# Patient Record
Sex: Female | Born: 1990 | Race: Black or African American | Hispanic: No | Marital: Single | Smoking: Never smoker
Health system: Southern US, Community
[De-identification: ages and names within clinical notes are randomized; demographics above are authoritative.]

## PROBLEM LIST (undated history)

## (undated) DIAGNOSIS — K449 Diaphragmatic hernia without obstruction or gangrene: Secondary | ICD-10-CM

## (undated) DIAGNOSIS — F419 Anxiety disorder, unspecified: Secondary | ICD-10-CM

## (undated) HISTORY — PX: WISDOM TOOTH EXTRACTION: SHX21

---

## 2011-05-22 ENCOUNTER — Emergency Department (INDEPENDENT_AMBULATORY_CARE_PROVIDER_SITE_OTHER): Payer: Medicaid Other

## 2011-05-22 ENCOUNTER — Emergency Department (HOSPITAL_COMMUNITY)
Admission: EM | Admit: 2011-05-22 | Discharge: 2011-05-22 | Disposition: A | Discharge status: Home / Self Care | Payer: Medicaid Other | Source: Home / Self Care | Attending: Emergency Medicine | Admitting: Emergency Medicine

## 2011-05-22 ENCOUNTER — Encounter (HOSPITAL_COMMUNITY): Payer: Self-pay | Admitting: *Deleted

## 2011-05-22 DIAGNOSIS — J40 Bronchitis, not specified as acute or chronic: Secondary | ICD-10-CM

## 2011-05-22 MED ORDER — ALBUTEROL SULFATE HFA 108 (90 BASE) MCG/ACT IN AERS: 1.0000 | INHALATION_SPRAY | Freq: Four times a day (QID) | RESPIRATORY_TRACT | Status: DC | PRN

## 2011-05-22 MED ORDER — ALBUTEROL SULFATE (5 MG/ML) 0.5% IN NEBU
5.0000 mg | INHALATION_SOLUTION | Freq: Once | RESPIRATORY_TRACT | Status: AC
  Administered 2011-05-22: 5 mg via RESPIRATORY_TRACT

## 2011-05-22 MED ORDER — PREDNISONE 20 MG PO TABS: 60.0000 mg | ORAL_TABLET | Freq: Every day | ORAL | Status: AC

## 2011-05-22 MED ORDER — IPRATROPIUM BROMIDE 0.02 % IN SOLN
0.5000 mg | Freq: Once | RESPIRATORY_TRACT | Status: AC
  Administered 2011-05-22: 0.5 mg via RESPIRATORY_TRACT

## 2011-05-22 MED ORDER — HYDROCODONE-HOMATROPINE 5-1.5 MG/5ML PO SYRP: 5.0000 mL | ORAL_SOLUTION | Freq: Four times a day (QID) | ORAL | Status: AC | PRN

## 2011-05-22 MED ORDER — ALBUTEROL SULFATE (5 MG/ML) 0.5% IN NEBU
INHALATION_SOLUTION | RESPIRATORY_TRACT | Status: AC
  Filled 2011-05-22: qty 0.5

## 2011-05-22 NOTE — ED Notes (Signed)
Co nonproductive cough x 3 wks, has taken mucinex and tylenol with no improvement

## 2011-05-22 NOTE — ED Provider Notes (Signed)
History     CSN: [id number]  Arrival date & time 05/22/11  1618   First MD Initiated Contact with Patient 05/22/11 1712      Chief Complaint  Patient presents with  . Cough    (Consider location/radiation/quality/duration/timing/severity/associated sxs/prior treatment) HPI Comments: Patient nonproductive cough for 3 weeks. States cough started while having upper respiratory infection with nasal congestion, rhinorrhea.Marland Kitchen URI improved however, patient now with wheezing, chest tightness, chest congestion. No nausea, vomiting, fevers, shortness of breath, abdominal pain. No malaise, fatigue, body aches. Is able to sleep at night. No reflux symptoms. Has been taking the Mucinex and Tylenol without improvement.  ROS as noted in HPI. All other ROS negative.   Patient is a 21 y.o. female presenting with cough.  Cough    History reviewed. No pertinent past medical history.  History reviewed. No pertinent past surgical history.  History reviewed. No pertinent family history.  History  Substance Use Topics  . Smoking status: Never Smoker   . Smokeless tobacco: Not on file  . Alcohol Use: Yes    OB History    Grav Para Term Preterm Abortions TAB SAB Ect Mult Living                  Review of Systems  Respiratory: Positive for cough.     Allergies  Robitussin and Sudafed  Home Medications   Current Outpatient Rx  Name Route Sig Dispense Refill  . GUAIFENESIN ER 600 MG PO TB12 Oral Take 1,200 mg by mouth 2 (two) times daily.    . ALBUTEROL SULFATE HFA 108 (90 BASE) MCG/ACT IN AERS Inhalation Inhale 1-2 puffs into the lungs every 6 (six) hours as needed for wheezing. 1 Inhaler 0  . HYDROCODONE-HOMATROPINE 5-1.5 MG/5ML PO SYRP Oral Take 5 mLs by mouth every 6 (six) hours as needed for cough or pain. 120 mL 0  . PREDNISONE 20 MG PO TABS Oral Take 3 tablets (60 mg total) by mouth daily. 15 tablet 0    BP 134/94  Pulse 80  Temp(Src) 98.5 F (36.9 C) (Oral)  Resp 20  SpO2  100%  Physical Exam  Nursing note and vitals reviewed. Constitutional: She is oriented to person, place, and time. She appears well-developed and well-nourished.  HENT:  Head: Normocephalic and atraumatic.  Right Ear: Tympanic membrane and ear canal normal.  Left Ear: Tympanic membrane and ear canal normal.  Nose: Nose normal.  Mouth/Throat: Uvula is midline, oropharynx is clear and moist and mucous membranes are normal.       (-) frontal, maxillary sinus tenderness  Eyes: Conjunctivae and EOM are normal.  Neck: Normal range of motion. Neck supple.  Cardiovascular: Normal rate, regular rhythm and normal heart sounds.   Pulmonary/Chest: Effort normal. No respiratory distress. She has wheezes. She has no rales. She exhibits no tenderness.  Abdominal: Soft. Bowel sounds are normal. There is no tenderness.  Musculoskeletal: Normal range of motion.  Lymphadenopathy:    She has no cervical adenopathy.  Neurological: She is alert and oriented to person, place, and time.  Skin: Skin is warm and dry. No rash noted.  Psychiatric: She has a normal mood and affect. Her behavior is normal. Judgment and thought content normal.    ED Course  Procedures (including critical care time)  Labs Reviewed - No data to display Dg Chest 2 View  05/22/2011  *RADIOLOGY REPORT*  Clinical Data: Cough.  CHEST - 2 VIEW  Comparison: None.  Findings: Cardiac and mediastinal contours  appear normal.  The lungs appear clear.  No pleural effusion is identified.  IMPRESSION:  No significant abnormality identified.  Original Report Authenticated By: Dellia Cloud, M.D.     1. Bronchitis      MDM  Patient with cough for 3 weeks. Has scattered wheezing throughout all lung fields. No history of asthma. H&P more consistent with bronchitis and pneumonia. Will give DuoNeb, check chest x-ray. Will reevaluate.  X-ray reviewed by myself. Report per radiologist.  On reevaluation, improved air movement, decreased  wheezing. Patient states she feels slightly better. Discussed imaging results with mother and patient.  Luiz Blare, MD 05/22/11 2151

## 2011-05-22 NOTE — ED Notes (Signed)
Pt receiving breathing treatment unable to transport to xray

## 2019-01-29 ENCOUNTER — Other Ambulatory Visit: Payer: Self-pay | Admitting: Otolaryngology

## 2019-01-29 DIAGNOSIS — R221 Localized swelling, mass and lump, neck: Secondary | ICD-10-CM

## 2019-02-04 ENCOUNTER — Ambulatory Visit
Admission: RE | Admit: 2019-02-04 | Discharge: 2019-02-04 | Disposition: A | Discharge status: Home / Self Care | Payer: Managed Care, Other (non HMO) | Source: Ambulatory Visit | Attending: Otolaryngology | Admitting: Otolaryngology

## 2019-02-04 ENCOUNTER — Other Ambulatory Visit: Payer: Self-pay

## 2019-02-04 DIAGNOSIS — R221 Localized swelling, mass and lump, neck: Secondary | ICD-10-CM

## 2019-02-04 MED ORDER — IOPAMIDOL (ISOVUE-300) INJECTION 61%
75.0000 mL | Freq: Once | INTRAVENOUS | Status: AC | PRN
  Administered 2019-02-04: 75 mL via INTRAVENOUS

## 2019-02-07 ENCOUNTER — Other Ambulatory Visit: Payer: Self-pay | Admitting: Otolaryngology

## 2019-02-07 ENCOUNTER — Other Ambulatory Visit (HOSPITAL_COMMUNITY): Payer: Self-pay | Admitting: Otolaryngology

## 2019-02-07 DIAGNOSIS — E041 Nontoxic single thyroid nodule: Secondary | ICD-10-CM

## 2019-02-07 DIAGNOSIS — R590 Localized enlarged lymph nodes: Secondary | ICD-10-CM

## 2019-02-07 DIAGNOSIS — R221 Localized swelling, mass and lump, neck: Secondary | ICD-10-CM

## 2019-02-14 ENCOUNTER — Encounter (HOSPITAL_COMMUNITY): Payer: Self-pay | Admitting: Emergency Medicine

## 2019-02-14 ENCOUNTER — Emergency Department (HOSPITAL_COMMUNITY)
Admission: EM | Admit: 2019-02-14 | Discharge: 2019-02-15 | Disposition: A | Discharge status: Home / Self Care | Payer: Managed Care, Other (non HMO) | Attending: Emergency Medicine | Admitting: Emergency Medicine

## 2019-02-14 ENCOUNTER — Other Ambulatory Visit: Payer: Self-pay

## 2019-02-14 ENCOUNTER — Emergency Department (HOSPITAL_COMMUNITY): Payer: Managed Care, Other (non HMO)

## 2019-02-14 DIAGNOSIS — R0602 Shortness of breath: Secondary | ICD-10-CM

## 2019-02-14 DIAGNOSIS — R079 Chest pain, unspecified: Secondary | ICD-10-CM

## 2019-02-14 MED ORDER — SODIUM CHLORIDE 0.9% FLUSH: 3.0000 mL | Freq: Once | INTRAVENOUS | Status: DC

## 2019-02-14 NOTE — ED Triage Notes (Signed)
Patient with chest discomfort, states that it comes and goes, she states that it is a funny feeling, not necessarily a pain, but tightness.  She states she saw her PCP on Monday and she said that she was not clear in her lungs.  She has had a "gunky" feeling.

## 2019-02-15 ENCOUNTER — Emergency Department (HOSPITAL_COMMUNITY): Payer: Managed Care, Other (non HMO)

## 2019-02-15 LAB — BASIC METABOLIC PANEL
Anion gap: 11 (ref 5–15)
BUN: 17 mg/dL (ref 6–20)
CO2: 24 mmol/L (ref 22–32)
Calcium: 9.7 mg/dL (ref 8.9–10.3)
Chloride: 103 mmol/L (ref 98–111)
Creatinine, Ser: 0.89 mg/dL (ref 0.44–1.00)
GFR calc Af Amer: >60 mL/min (ref >60)
GFR calc non Af Amer: >60 mL/min (ref >60)
Glucose, Bld: 93 mg/dL (ref 70–99)
Potassium: 4 mmol/L (ref 3.5–5.1)
Sodium: 138 mmol/L (ref 135–145)

## 2019-02-15 LAB — CBC
HCT: 43 % (ref 36.0–46.0)
Hemoglobin: 13.5 g/dL (ref 12.0–15.0)
MCH: 28.5 pg (ref 26.0–34.0)
MCHC: 31.4 g/dL (ref 30.0–36.0)
MCV: 90.7 fL (ref 80.0–100.0)
Platelets: 305 10*3/uL (ref 150–400)
RBC: 4.74 MIL/uL (ref 3.87–5.11)
RDW: 12.2 % (ref 11.5–15.5)
WBC: 12.9 10*3/uL — ABNORMAL HIGH (ref 4.0–10.5)
nRBC: 0 % (ref 0.0–0.2)

## 2019-02-15 LAB — I-STAT BETA HCG BLOOD, ED (MC, WL, AP ONLY): I-stat hCG, quantitative: <5 m[IU]/mL (ref <5)

## 2019-02-15 LAB — TROPONIN I (HIGH SENSITIVITY)
Troponin I (High Sensitivity): 3 ng/L (ref <18)
Troponin I (High Sensitivity): 4 ng/L (ref <18)

## 2019-02-15 LAB — D-DIMER, QUANTITATIVE: D-Dimer, Quant: 0.88 ug/mL-FEU — ABNORMAL HIGH (ref 0.00–0.50)

## 2019-02-15 MED ORDER — IOHEXOL 350 MG/ML SOLN
100.0000 mL | Freq: Once | INTRAVENOUS | Status: AC | PRN
  Administered 2019-02-15: 56 mL via INTRAVENOUS

## 2019-02-15 NOTE — ED Notes (Signed)
ED Provider at bedside. 

## 2019-02-15 NOTE — ED Notes (Addendum)
Patient returned from [place].  Transporter reports can't power inject through IV.

## 2019-02-15 NOTE — ED Notes (Signed)
Patient verbalizes understanding of discharge instructions. Opportunity for questioning and answers were provided. Armband removed by staff, pt discharged from ED home via POV.  

## 2019-02-15 NOTE — Discharge Instructions (Addendum)
You have been seen today for chest pain. Please read and follow all provided instructions. Return to the emergency room for worsening condition or new concerning symptoms.    Your CT scan did not show a blood clot.  It does show small hiatal hernia. I have included information about them for you to look over.   1. Medications:  You can take tylenol and ibuprofen as needed for pain. Take as directed on the bottle. Continue usual home medications.   2. Treatment: rest, drink plenty of fluids  3. Follow Up: Please follow up with your primary doctor in 2-5 days for discussion of your diagnoses and further evaluation after today's visit; Call today to arrange your follow up.  -Also recommend you follow up with ENT for your biopsy as planned  It is also a possibility that you have an allergic reaction to any of the medicines that you have been prescribed - Everybody reacts differently to medications and while MOST people have no trouble with most medicines, you may have a reaction such as nausea, vomiting, rash, swelling, shortness of breath. If this is the case, please stop taking the medicine immediately and contact your physician.  ?

## 2019-02-15 NOTE — ED Notes (Signed)
Patient transported to CT 

## 2019-02-15 NOTE — ED Provider Notes (Signed)
[place] EMERGENCY DEPARTMENT Provider Note   CSN: [id number] Arrival date & time: 02/14/19  2308     History   Chief Complaint Chief Complaint  Patient presents with  . Chest Pain    HPI Aleene Swanner is a 28 y.o. female with a hx of major medical problems presents to the Emergency Department complaining of intermittent chest tightness and discomfort over the last several days.  Patient reports there is no chest pain but sometimes she feels as if she is having to focus on breathing or take a deep breath to get enough oxygen.  No specific aggravating or alleviating factors.  The symptoms are not worse with exertion.  She denies orthopnea.  No leg swelling.  She does not take an oral contraceptive.  No history of cancer, lupus, previous DVT/PE, leg swelling.  Patient does report that she had Covid in July and though recovered has had some intermittent but strange symptoms including lymphadenopathy over the last several months.  (Of note: CT scan of her neck revealed some global lymphadenopathy for which she was evaluated by ENT.  They did recommend a lymph node biopsy to rule out lymphoma.  She has not yet been able to schedule this.)  She did see her primary care about this chest tightness 2 days ago and was given an antibiotic.  It has not helped her symptoms.  She was retested for Covid at that time and found to be negative.  Additionally, patient reports she is some anxious about the symptoms.     The history is provided by the patient, medical records and a friend. No language interpreter was used.    History reviewed. No pertinent past medical history.  There are no active problems to display for this patient.   History reviewed. No pertinent surgical history.   OB History   No obstetric history on file.      Home Medications    Prior to Admission medications   Medication Sig Start Date End Date Taking? Authorizing Provider  albuterol (PROVENTIL  HFA;VENTOLIN HFA) 108 (90 BASE) MCG/ACT inhaler Inhale 1-2 puffs into the lungs every 6 (six) hours as needed for wheezing. 05/22/11 05/21/12  Melynda Ripple, MD  guaiFENesin (MUCINEX) 600 MG 12 hr tablet Take 1,200 mg by mouth 2 (two) times daily.    [provider]    Family History No family history on file.  Social History Social History   Tobacco Use  . Smoking status: Never Smoker  Substance Use Topics  . Alcohol use: Yes  . Drug use: No     Allergies   Robitussin [guaifenesin] and Sudafed [pseudoephedrine hcl]   Review of Systems Review of Systems  Constitutional: Negative for appetite change, diaphoresis, fatigue, fever and unexpected weight change.  HENT: Negative for mouth sores.   Eyes: Negative for visual disturbance.  Respiratory: Negative for cough, chest tightness, shortness of breath and wheezing.   Cardiovascular: Positive for chest pain.  Gastrointestinal: Negative for abdominal pain, constipation, diarrhea, nausea and vomiting.  Endocrine: Negative for polydipsia, polyphagia and polyuria.  Genitourinary: Negative for dysuria, frequency, hematuria and urgency.  Musculoskeletal: Negative for back pain and neck stiffness.  Skin: Negative for rash.  Allergic/Immunologic: Negative for immunocompromised state.  Neurological: Negative for syncope, light-headedness and headaches.  Hematological: Does not bruise/bleed easily.  Psychiatric/Behavioral: Negative for sleep disturbance. The patient is nervous/anxious.      Physical Exam Updated Vital Signs BP 123/60 (BP Location: Right Arm)   Pulse Marland Kitchen)  114   Temp 98.5 F (36.9 C) (Oral)   Resp 18   SpO2 100%   Physical Exam Vitals signs and nursing note reviewed.  Constitutional:      General: She is not in acute distress.    Appearance: She is not diaphoretic.  HENT:     Head: Normocephalic.  Eyes:     General: No scleral icterus.    Conjunctiva/sclera: Conjunctivae normal.  Neck:      Musculoskeletal: Normal range of motion.  Cardiovascular:     Rate and Rhythm: Normal rate and regular rhythm.     Pulses: Normal pulses.          Radial pulses are 2+ on the right side and 2+ on the left side.     Heart sounds: Normal heart sounds. No murmur.  Pulmonary:     Effort: Pulmonary effort is normal. No tachypnea, accessory muscle usage, prolonged expiration, respiratory distress or retractions.     Breath sounds: Normal breath sounds. No stridor. No decreased breath sounds, wheezing, rhonchi or rales.     Comments: Equal chest rise. No increased work of breathing. Abdominal:     General: There is no distension.     Palpations: Abdomen is soft.     Tenderness: There is no abdominal tenderness. There is no guarding or rebound.  Musculoskeletal:     Right lower leg: She exhibits no tenderness. No edema.     Left lower leg: She exhibits no tenderness. No edema.     Comments: Moves all extremities equally and without difficulty.  Skin:    General: Skin is warm and dry.     Capillary Refill: Capillary refill takes less than 2 seconds.  Neurological:     Mental Status: She is alert.     GCS: GCS eye subscore is 4. GCS verbal subscore is 5. GCS motor subscore is 6.     Comments: Speech is clear and goal oriented.  Psychiatric:        Mood and Affect: Mood normal.      ED Treatments / Results  Labs (all labs ordered are listed, but only abnormal results are displayed) Labs Reviewed  CBC - Abnormal; Notable for the following components:      Result Value   WBC 12.9 (*)    All other components within normal limits  D-DIMER, QUANTITATIVE (NOT AT [facility]) - Abnormal; Notable for the following components:   D-Dimer, Quant 0.88 (*)    All other components within normal limits  BASIC METABOLIC PANEL  I-STAT BETA HCG BLOOD, ED ([facility], [facility], [facility] ONLY)  TROPONIN I (HIGH SENSITIVITY)  TROPONIN I (HIGH SENSITIVITY)    EKG EKG Interpretation  Date/Time:  Thursday February 14 2019  23:25:32 EDT Ventricular Rate:  91 PR Interval:  156 QRS Duration: 80 QT Interval:  360 QTC Calculation: 442 R Axis:   22 Text Interpretation:  Normal sinus rhythm Cannot rule out Anterior infarct , age undetermined Abnormal ECG No previous ECGs available Confirmed by Zadie Rhine ([id number]) on 02/15/2019 5:16:23 AM   Radiology Dg Chest 2 View  Result Date: 02/14/2019 CLINICAL DATA:  Chest pain. Intermittent chest discomfort. EXAM: CHEST - 2 VIEW COMPARISON:  05/22/2011 FINDINGS: The cardiomediastinal contours are normal. Mild central bronchial thickening. Pulmonary vasculature is normal. No consolidation, pleural effusion, or pneumothorax. Minimal degenerative change in the midthoracic spine. No acute osseous abnormalities are seen. IMPRESSION: Mild central bronchial thickening. Electronically Signed   By: Narda Rutherford M.D.   On: 02/14/2019  23:56    Procedures Procedures (including critical care time)  Medications Ordered in ED Medications  sodium chloride flush (NS) 0.9 % injection 3 mL (has no administration in time range)     Initial Impression / Assessment and Plan / ED Course  I have reviewed the triage vital signs and the nursing notes.  Pertinent labs & imaging results that were available during my care of the patient were reviewed by me and considered in my medical decision making (see chart for details).  Clinical Course as of Feb 14 706  Fri Feb 15, 2019  [id number] Tachycardic on arrival.  No tachycardia on my clinical exam.  Pulse Rate(!): 114 [HM]  0553 Mild leukocytosis noted however other labs are reassuring.  WBC(!): 12.9 [HM]  0554 I personally evaluated these images.  No evidence of pneumonia, pulmonary edema or pneumothorax.  DG Chest 2 View [HM]  0554 No evidence of ischemia.  EKG 12-Lead [HM]  0554 Initial and repeat troponin are negative.  Troponin I (High Sensitivity): 3 [HM]    Clinical Course User Index [HM] Kellin Bartling, Dahlia ClientHannah, PA-C         Patient presents to the emergency department with complaints of shortness of breath and chest pressure.  She is tachycardic on arrival.  All of her other vital signs are within normal limits.  Labs are reassuring.  Mild leukocytosis.  Given her tachycardia and consistent symptoms for several days along with recent Covid infection and concern about possible lymphoma I have concern for increased risk for pulmonary embolism.  She is otherwise low risk.  Will obtain D-dimer.  D-dimer elevated at 0.88.  At this time I recommended CT scan of the chest.  Discussed the risk versus benefit with the patient and she agrees to move forward with CT scan.  7:03 AM At shift change, [facility] PA-[facility] who will follow results of CT scan and disposition accordingly.   Final Clinical Impressions(s) / ED Diagnoses   Final diagnoses:  Shortness of breath    ED Discharge Orders    None       Mardene SayerMuthersbaugh, Boyd KerbsHannah, PA-C 02/15/19 [id number]    Zadie RhineWickline, Donald, MD 02/15/19 [phone]

## 2019-02-15 NOTE — ED Provider Notes (Signed)
Care assumed from H. Muthersbaugh PA-C at shift change pending CTA chest.  See her note for full H&P.   Work up has been significant for elevated D-dimer at 0.88. Negative pregnancy test, negative troponin, nonspecific leukocytosis 12.9. EKG normal sinus rhythm. Chest xray without acute infectious findings, only mild central bronchial thickening.  If CTA is negative and vitals are stable pt can be discharged home with pcp follow up. If any findings will follow up accordingly. Pt is resting comfortably in bed at this time.     Physical Exam  BP (!) 150/108 (BP Location: Right Wrist)   Pulse 89   Temp 98.5 F (36.9 C) (Oral)   Resp 14   SpO2 100%   Physical Exam  PE: Constitutional: well-developed, well-nourished, no apparent distress HENT: normocephalic, atraumatic Cardiovascular: normal rate and rhythm, distal pulses intact. Radial pulses 2+ bilaterally.  Pulmonary/Chest: effort normal; breath sounds clear and equal bilaterally; no wheezes or rales. Abdominal: soft and nontender Musculoskeletal: full ROM, no edema Neurological: alert with goal directed thinking Skin: warm and dry, no rash, no diaphoresis Psychiatric: normal mood and affect, normal behavior    ED Course/Procedures   Clinical Course as of Feb 15 719  Fri Feb 15, 2019  0553 Tachycardic on arrival.  No tachycardia on my clinical exam.  Pulse Rate(!): 114 [HM]  0553 Mild leukocytosis noted however other labs are reassuring.  WBC(!): 12.9 [HM]  0554 I personally evaluated these images.  No evidence of pneumonia, pulmonary edema or pneumothorax.  DG Chest 2 View [HM]  5361 No evidence of ischemia.  EKG 12-Lead [HM]  0554 Initial and repeat troponin are negative.  Troponin I (High Sensitivity): 3 [HM]    Clinical Course User Index [HM] Muthersbaugh, Gwenlyn Perking   CHEST - 2 VIEW    COMPARISON: 05/22/2011    FINDINGS:  The cardiomediastinal contours are normal. Mild central bronchial  thickening.  Pulmonary vasculature is normal. No consolidation,  pleural effusion, or pneumothorax. Minimal degenerative change in  the midthoracic spine. No acute osseous abnormalities are seen.    IMPRESSION:  Mild central bronchial thickening.      Electronically Signed  By: Keith Rake M.D.  On: 02/14/2019 23:56     EKG Interpretation  Date/Time:  Thursday February 14 2019 23:25:32 EDT Ventricular Rate:  91 PR Interval:  156 QRS Duration: 80 QT Interval:  360 QTC Calculation: 442 R Axis:   22 Text Interpretation:  Normal sinus rhythm Cannot rule out Anterior infarct , age undetermined Abnormal ECG No previous ECGs available Confirmed by Ripley Fraise [phone]) on 02/15/2019 5:16:23 AM        MDM    Pt received in sign out pending CTA chest. Her history is significant for Covid diagnosis x3 months ago. Pt reportedly recovered but has had intermittent symptoms including several months of lymphadenopathy that is currently being followed by ENT. Had recent negative Covid test. As mentioned above labs today are significant for elevated dimer 0.88. On reassessment she is pain free.  Her lungs are clear to auscultation all fields.  She has normal work of breathing symmetric chest rise.  Abdomen is soft nontender, no peritoneal signs.  CTA chest is negative for PE. Incidental finding of small hiatal hernia. Discussed results with patient.  The patient appears reasonably screened and/or stabilized for discharge and I doubt any other medical condition or other [facility] requiring further screening, evaluation, or treatment in the ED at this time prior to discharge. The patient is safe for  discharge with strict return precautions discussed. Recommend pcp follow up.   Portions of this note were generated with Scientist, clinical (histocompatibility and immunogenetics). Dictation errors may occur despite best attempts at proofreading.    Vitals:   02/15/19 0626 02/15/19 0659 02/15/19 0730 02/15/19 0945  BP: (!) 96/53 (!)  150/108 (!) 143/54 112/75  Pulse: 89  83 93  Resp: 14  20 18   Temp: 98.5 F (36.9 C)  98.2 F (36.8 C) 98.2 F (36.8 C)  TempSrc: Oral  Oral Oral  SpO2: 100%  99% 100%       , PA-C 02/15/19 1547    02/17/19, MD 02/18/19 0730

## 2019-02-15 NOTE — ED Notes (Addendum)
Per CT, patient needs IV gauge 20 for CT.  Will place IV team consult.

## 2019-02-15 NOTE — ED Notes (Signed)
Patient transported to CT by RN.  CT to transport patient to room 47 in adult ED after CT.  Notified Event organiser, Therapist, sports.

## 2019-02-18 ENCOUNTER — Encounter (HOSPITAL_COMMUNITY): Payer: Self-pay | Admitting: Radiology

## 2019-02-18 NOTE — Progress Notes (Unsigned)
[place] Female, 28 y.o., December 12, 1990 MRN:  [id number] Phone:  [phone] Jerilynn Mages) PCP:  Ronita Hipps, MD Coverage:  [employer] Managed Next Appt With Radiology ([facility] 2) 02/22/2019 at 1:00 PM  FW: Biopsu Received: Today Message Contents  Donn Pierini D      Previous Messages  ----- Message -----  From: Markus Daft, MD  Sent: 02/08/2019  9:11 AM EDT  To: Lenore Cordia  Subject: RE: Biopsu                    Ok for US guided cervical lymph node biopsy.   [place]  ----- Message -----  From: Lenore Cordia  Sent: 02/07/2019  5:05 PM EDT  To: Ir Procedure Requests  Subject: Biopsu                      Procedure Requested: US Biopsy    Reason for Procedure: Cervical lymphadenopathy. Please sample the left jugulodigastric node which is the largest    Provider Requesting: Dr Izora Gala  Provider Telephone: [phone]    Other Info: Rad exam in Epic

## 2019-02-21 ENCOUNTER — Other Ambulatory Visit: Payer: Self-pay | Admitting: Radiology

## 2019-02-22 ENCOUNTER — Ambulatory Visit (HOSPITAL_COMMUNITY)
Admission: RE | Admit: 2019-02-22 | Discharge: 2019-02-22 | Disposition: A | Discharge status: Home / Self Care | Payer: Managed Care, Other (non HMO) | Source: Ambulatory Visit | Attending: Otolaryngology | Admitting: Otolaryngology

## 2019-02-22 ENCOUNTER — Other Ambulatory Visit: Payer: Self-pay

## 2019-02-22 DIAGNOSIS — R590 Localized enlarged lymph nodes: Secondary | ICD-10-CM | POA: Insufficient documentation

## 2019-02-22 DIAGNOSIS — Z79899 Other long term (current) drug therapy: Secondary | ICD-10-CM | POA: Diagnosis not present

## 2019-02-22 DIAGNOSIS — R221 Localized swelling, mass and lump, neck: Secondary | ICD-10-CM

## 2019-02-22 LAB — CBC
HCT: 40.1 % (ref 36.0–46.0)
Hemoglobin: 12.8 g/dL (ref 12.0–15.0)
MCH: 28.8 pg (ref 26.0–34.0)
MCHC: 31.9 g/dL (ref 30.0–36.0)
MCV: 90.3 fL (ref 80.0–100.0)
Platelets: 293 10*3/uL (ref 150–400)
RBC: 4.44 MIL/uL (ref 3.87–5.11)
RDW: 12.6 % (ref 11.5–15.5)
WBC: 9 10*3/uL (ref 4.0–10.5)
nRBC: 0 % (ref 0.0–0.2)

## 2019-02-22 LAB — PROTIME-INR
INR: 1 (ref 0.8–1.2)
Prothrombin Time: 12.8 seconds (ref 11.4–15.2)

## 2019-02-22 LAB — PREGNANCY, URINE: Preg Test, Ur: NEGATIVE

## 2019-02-22 MED ORDER — SODIUM CHLORIDE 0.9 % IV SOLN: INTRAVENOUS | Status: DC

## 2019-02-22 MED ORDER — MIDAZOLAM HCL 2 MG/2ML IJ SOLN
INTRAMUSCULAR | Status: AC | PRN
  Administered 2019-02-22: 2 mg via INTRAVENOUS

## 2019-02-22 MED ORDER — MIDAZOLAM HCL 2 MG/2ML IJ SOLN
INTRAMUSCULAR | Status: AC
  Filled 2019-02-22: qty 2

## 2019-02-22 MED ORDER — FENTANYL CITRATE (PF) 100 MCG/2ML IJ SOLN
INTRAMUSCULAR | Status: AC
  Filled 2019-02-22: qty 2

## 2019-02-22 MED ORDER — FENTANYL CITRATE (PF) 100 MCG/2ML IJ SOLN
INTRAMUSCULAR | Status: AC | PRN
  Administered 2019-02-22: 25 ug via INTRAVENOUS
  Administered 2019-02-22: 50 ug via INTRAVENOUS

## 2019-02-22 MED ORDER — LIDOCAINE-EPINEPHRINE 1 %-1:100000 IJ SOLN
INTRAMUSCULAR | Status: AC
  Filled 2019-02-22: qty 1

## 2019-02-22 NOTE — Procedures (Signed)
Pre Procedure Dx: Cervical lymphadenopathy Post Procedural Dx: Same  Technically successful US guided biopsy of dominant left sided cervical lymph node.   EBL: None  No immediate complications.   Ronny Bacon, MD Pager #: [phone]

## 2019-02-22 NOTE — H&P (Signed)
Chief Complaint: Patient was seen in consultation today for lymphadenopathy  Referring Physician(s): Rosen,Jefry  Supervising Physician: Simonne Come  Patient Status: [facility] - Out-pt  History of Present Illness: Courtney Vazquez is a 28 y.o. female with no significant past medical history with the exception of COVID infection in July of this year who presented to her PCP with lymphadenopathy.  She was evlauated by ENT who referred her to IR for percutaneous lymph node biopsy.   She presents today in her usual state of health. She is nervous but ready to go through with procedure.  She has been NPO.  She does not take blood thinners.   No past medical history on file.  No past surgical history on file.  Allergies: Robitussin [guaifenesin] and Sudafed [pseudoephedrine hcl]  Medications: Prior to Admission medications   Medication Sig Start Date End Date Taking? Authorizing Provider  Ascorbic Acid (VITAMIN C GUMMIE PO) Take 3 tablets by mouth daily.   Yes [provider]  ELDERBERRY PO Take 2 tablets by mouth daily. Gummies   Yes [provider]  Multiple Vitamins-Minerals (ADULT GUMMY PO) Take 2 tablets by mouth daily.   Yes [provider]  guaiFENesin (MUCINEX) 600 MG 12 hr tablet Take 1,200 mg by mouth 2 (two) times daily.    [provider]  levocetirizine (XYZAL) 5 MG tablet Take 5 mg by mouth daily.    [provider]     No family history on file.  Social History   Socioeconomic History   Marital status: Single    Spouse name: Not on file   Number of children: Not on file   Years of education: Not on file   Highest education level: Not on file  Occupational History   Not on file  Social Needs   Financial resource strain: Not on file   Food insecurity    Worry: Not on file    Inability: Not on file   Transportation needs    Medical: Not on file    Non-medical: Not on file  Tobacco Use   Smoking  status: Never Smoker  Substance and Sexual Activity   Alcohol use: Yes   Drug use: No   Sexual activity: Not on file  Lifestyle   Physical activity    Days per week: Not on file    Minutes per session: Not on file   Stress: Not on file  Relationships   Social connections    Talks on phone: Not on file    Gets together: Not on file    Attends religious service: Not on file    Active member of club or organization: Not on file    Attends meetings of clubs or organizations: Not on file    Relationship status: Not on file  Other Topics Concern   Not on file  Social History Narrative   Not on file     Review of Systems: A 12 point ROS discussed and pertinent positives are indicated in the HPI above.  All other systems are negative.  Review of Systems  Constitutional: Negative for fatigue and fever.  Respiratory: Negative for cough and shortness of breath.   Cardiovascular: Negative for chest pain.  Gastrointestinal: Negative for abdominal pain, nausea and vomiting.  Genitourinary: Negative for dysuria.  Musculoskeletal: Negative for back pain.  Psychiatric/Behavioral: Negative for behavioral problems and confusion.    Vital Signs: BP (!) 136/93    Pulse 88    Temp 98.1 F (  36.7 C) (Skin)    Resp 18    Ht 5\' 2"  (1.575 m)    Wt (!) 311 lb (141.1 kg)    SpO2 100%    BMI 56.88 kg/m   Physical Exam Vitals signs and nursing note reviewed.  Constitutional:      General: She is not in acute distress.    Appearance: She is not ill-appearing.  HENT:     Mouth/Throat:     Mouth: Mucous membranes are moist.     Pharynx: Oropharynx is clear.  Neck:     Musculoskeletal: Normal range of motion and neck supple. No muscular tenderness.  Cardiovascular:     Rate and Rhythm: Normal rate and regular rhythm.  Pulmonary:     Effort: Pulmonary effort is normal. No respiratory distress.     Breath sounds: Normal breath sounds.  Abdominal:     General: Abdomen is flat.      Palpations: Abdomen is soft.  Lymphadenopathy:     Cervical: Cervical adenopathy (palpable on right) present.  Neurological:     General: No focal deficit present.     Mental Status: She is alert and oriented to person, place, and time. Mental status is at baseline.  Psychiatric:        Mood and Affect: Mood normal.        Behavior: Behavior normal.        Thought Content: Thought content normal.        Judgment: Judgment normal.      MD Evaluation Airway: WNL Heart: WNL Abdomen: WNL Chest/ Lungs: WNL ASA  Classification: 3 Mallampati/Airway Score: Three   Imaging: Dg Chest 2 View  Result Date: 02/14/2019 CLINICAL DATA:  Chest pain. Intermittent chest discomfort. EXAM: CHEST - 2 VIEW COMPARISON:  05/22/2011 FINDINGS: The cardiomediastinal contours are normal. Mild central bronchial thickening. Pulmonary vasculature is normal. No consolidation, pleural effusion, or pneumothorax. Minimal degenerative change in the midthoracic spine. No acute osseous abnormalities are seen. IMPRESSION: Mild central bronchial thickening. Electronically Signed   By: Narda RutherfordMelanie  Sanford M.D.   On: 02/14/2019 23:56   Ct Soft Tissue Neck W Contrast  Result Date: 02/04/2019 CLINICAL DATA:  Mass on right side of neck near parotid gland EXAM: CT NECK WITH CONTRAST TECHNIQUE: Multidetector CT imaging of the neck was performed using the standard protocol following the bolus administration of intravenous contrast. CONTRAST:  75mL ISOVUE-300 IOPAMIDOL (ISOVUE-300) INJECTION 61% COMPARISON:  None. FINDINGS: Pharynx and larynx: Thickening of Waldeyer's ring with palatine tonsils touching in the midline. No edema or focal masslike finding Salivary glands: Palpable complaint reflects a homogeneous lobulated nodule in the right parotid tail measuring 18 mm. This is most likely lymphadenopathy given below. Thyroid: Normal Lymph nodes: Generalized nodal enlargement in the bilateral neck with homogeneous appearance. The  largest is the left jugulodigastric node measuring 28 mm in maximal length Vascular: Negative Limited intracranial: Negative Visualized orbits: Not covered Mastoids and visualized paranasal sinuses: Clear Skeleton: No acute or aggressive finding Upper chest: Clear apical lungs IMPRESSION: Palpable complaint in the right parotid tail is most likely lymphadenopathy given generalized nodal enlargement. The adenopathy could be from lymphoproliferative disease, systemic infection, or autoimmune disease. Electronically Signed   By: Marnee SpringJonathon  Watts M.D.   On: 02/04/2019 09:34   Ct Angio Chest Pe W And/or Wo Contrast  Result Date: 02/15/2019 CLINICAL DATA:  Positive D-dimer.  Chest discomfort. EXAM: CT ANGIOGRAPHY CHEST WITH CONTRAST TECHNIQUE: Multidetector CT imaging of the chest was performed using the standard protocol  during bolus administration of intravenous contrast. Multiplanar CT image reconstructions and MIPs were obtained to evaluate the vascular anatomy. CONTRAST:  8mL OMNIPAQUE IOHEXOL 350 MG/ML SOLN COMPARISON:  None. FINDINGS: Cardiovascular: There are linear filling defects seen within multiple pulmonary arteries, particularly in the lower lobes. However, similar appearance is seen in the adjacent pulmonary veins and these appear linear and artifactual on coronal reconstructed images. These are likely artifactual related to body habitus. I see no pulmonary embolus to the segmental level. Heart is normal size. Aorta is normal caliber. Mediastinum/Nodes: No mediastinal, hilar, or axillary adenopathy. Trachea and esophagus are unremarkable. Small hiatal hernia. Thyroid unremarkable. Lungs/Pleura: Lungs are clear. No focal airspace opacities or suspicious nodules. No effusions. Upper Abdomen: Imaging into the upper abdomen shows no acute findings. Musculoskeletal: No acute bony abnormality. Review of the MIP images confirms the above findings. IMPRESSION: No central pulmonary embolus or embolus to the  segmental level. Artifact noted peripherally. No acute cardiopulmonary disease. Electronically Signed   By: Rolm Baptise M.D.   On: 02/15/2019 10:47    Labs:  CBC: Recent Labs    02/14/19 2337 02/22/19 1048  WBC 12.9* 9.0  HGB 13.5 12.8  HCT 43.0 40.1  PLT 305 293    COAGS: Recent Labs    02/22/19 1048  INR 1.0    BMP: Recent Labs    02/14/19 2337  NA 138  K 4.0  CL 103  CO2 24  GLUCOSE 93  BUN 17  CALCIUM 9.7  CREATININE 0.89  GFRNONAA >60  GFRAA >60    LIVER FUNCTION TESTS: No results for input(s): BILITOT, AST, ALT, ALKPHOS, PROT, ALBUMIN in the last 8760 hours.  TUMOR MARKERS: No results for input(s): AFPTM, CEA, CA199, CHROMGRNA in the last 8760 hours.  Assessment and Plan: Patient with past medical history of COVID infection presents with complaint of lymphadenopathy.  IR consulted for lymph node biopsy at the request of Dr. Constance Holster. Case reviewed by Dr. Pascal Lux who approves patient for procedure.  Patient presents today in their usual state of health.  She has been NPO and is not currently on blood thinners.   Risks and benefits of lymph node biopsy was discussed with the patient and/or patient's family including, but not limited to bleeding, infection, damage to adjacent structures or low yield requiring additional tests.  All of the questions were answered and there is agreement to proceed.  Consent signed and in chart.  Thank you for this interesting consult.  I greatly enjoyed meeting Vanecia M K Howland and look forward to participating in their care.  A copy of this report was sent to the requesting provider on this date.  Electronically Signed: Docia Barrier, PA 02/22/2019, 1:38 PM   I spent a total of  30 Minutes   in face to face in clinical consultation, greater than 50% of which was counseling/coordinating care for lymphadenopathy.

## 2019-02-22 NOTE — Discharge Instructions (Addendum)
Needle Biopsy, Care After °This sheet gives you information about how to care for yourself after your procedure. Your health care provider may also give you more specific instructions. If you have problems or questions, contact your health care provider. °What can I expect after the procedure? °After the procedure, it is common to have soreness, bruising, or mild pain at the puncture site. This should go away in a few days. °Follow these instructions at home: °Needle insertion site care ° °· Wash your hands with soap and water before you change your bandage (dressing). If you cannot use soap and water, use hand sanitizer. °· Follow instructions from your health care provider about how to take care of your puncture site. This includes: °? When and how to change your dressing. °? When to remove your dressing. °· Check your puncture site every day for signs of infection. Check for: °? Redness, swelling, or pain. °? Fluid or blood. °? Pus or a bad smell. °? Warmth. °General instructions °· Return to your normal activities as told by your health care provider. Ask your health care provider what activities are safe for you. °· Do not take baths, swim, or use a hot tub until your health care provider approves. Ask your health care provider if you may take showers. You may only be allowed to take sponge baths. °· Take over-the-counter and prescription medicines only as told by your health care provider. °· Keep all follow-up visits as told by your health care provider. This is important. °Contact a health care provider if: °· You have a fever. °· You have redness, swelling, or pain at the puncture site that lasts longer than a few days. °· You have fluid, blood, or pus coming from your puncture site. °· Your puncture site feels warm to the touch. °Get help right away if: °· You have severe bleeding from the puncture site. °Summary °· After the procedure, it is common to have soreness, bruising, or mild pain at the puncture  site. This should go away in a few days. °· Check your puncture site every day for signs of infection, such as redness, swelling, or pain. °· Get help right away if you have severe bleeding from your puncture site. °This information is not intended to replace advice given to you by your health care provider. Make sure you discuss any questions you have with your health care provider. °Document Released: 08/26/2014 Document Revised: 06/23/2017 Document Reviewed: 04/24/2017 °Elsevier Patient Education © 2020 [employer] °Moderate Conscious Sedation, Adult, Care After °These instructions provide you with information about caring for yourself after your procedure. Your health care provider may also give you more specific instructions. Your treatment has been planned according to current medical practices, but problems sometimes occur. Call your health care provider if you have any problems or questions after your procedure. °What can I expect after the procedure? °After your procedure, it is common: °· To feel sleepy for several hours. °· To feel clumsy and have poor balance for several hours. °· To have poor judgment for several hours. °· To vomit if you eat too soon. °Follow these instructions at home: °For at least 24 hours after the procedure: ° °· Do not: °? Participate in activities where you could fall or become injured. °? Drive. °? Use heavy machinery. °? Drink alcohol. °? Take sleeping pills or medicines that cause drowsiness. °? Make important decisions or sign legal documents. °? Take care of children on your own. °· Rest. °Eating and   drinking °· Follow the diet recommended by your health care provider. °· If you vomit: °? Drink water, juice, or soup when you can drink without vomiting. °? Make sure you have little or no nausea before eating solid foods. °General instructions °· Have a responsible adult stay with you until you are awake and alert. °· Take over-the-counter and prescription medicines only as  told by your health care provider. °· If you smoke, do not smoke without supervision. °· Keep all follow-up visits as told by your health care provider. This is important. °Contact a health care provider if: °· You keep feeling nauseous or you keep vomiting. °· You feel light-headed. °· You develop a rash. °· You have a fever. °Get help right away if: °· You have trouble breathing. °This information is not intended to replace advice given to you by your health care provider. Make sure you discuss any questions you have with your health care provider. °Document Released: 01/30/2013 Document Revised: 03/24/2017 Document Reviewed: 08/01/2015 °Elsevier Patient Education © 2020 [employer] ° °

## 2019-02-26 LAB — SURGICAL PATHOLOGY

## 2019-11-30 IMAGING — CT CT ANGIO CHEST
2 of 7 series · 18 of 46 positions shown · IV contrast (omnipaque)
Comparison: None.

CLINICAL DATA: Positive D-dimer.  Chest discomfort.

EXAM:
CT ANGIOGRAPHY CHEST WITH CONTRAST
TECHNIQUE: Multidetector CT imaging of the chest was performed using the
standard protocol during bolus administration of intravenous
contrast. Multiplanar CT image reconstructions and MIPs were
obtained to evaluate the vascular anatomy.
CONTRAST:  56mL OMNIPAQUE IOHEXOL 350 MG/ML SOLN

[Series 8: thins · axial · 0.69mm/px · z∈[+1142,+1401]mm · 15 of 419 slices shown]
[im 24/419  lung]
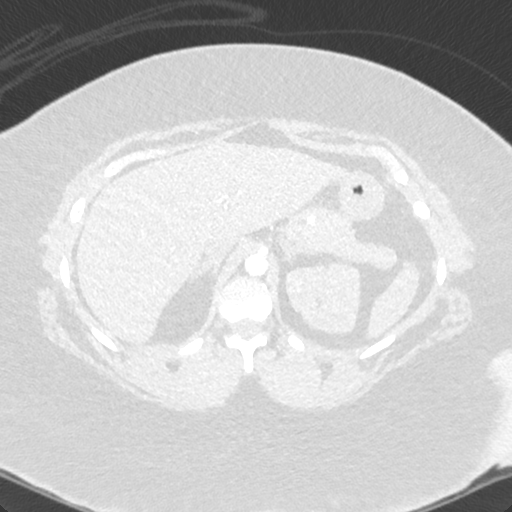
[im 47/419  soft-tissue]
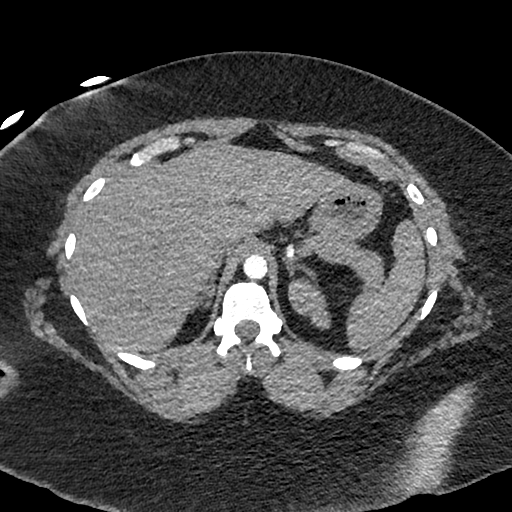
[im 70/419  lung]
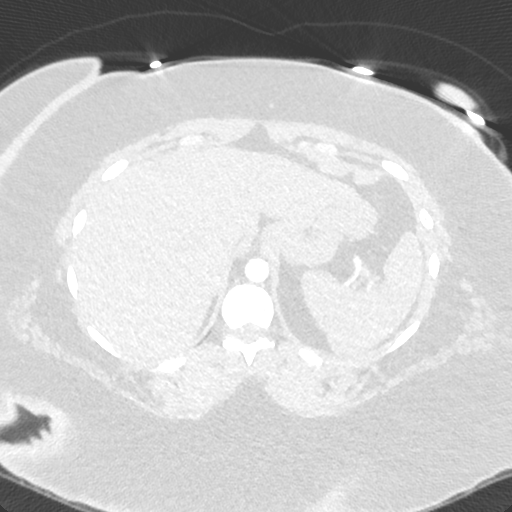
[im 93/419  soft-tissue]
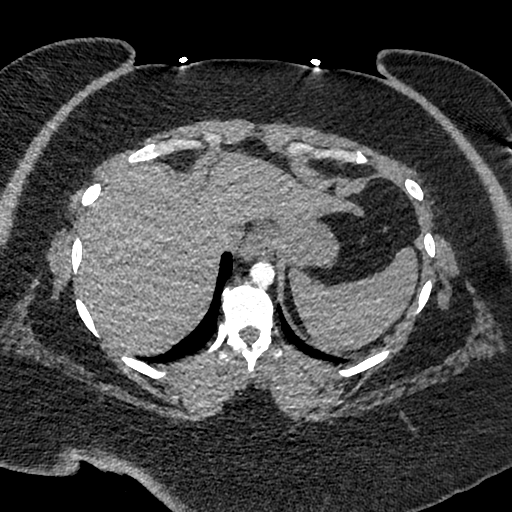
[im 140/419  lung]
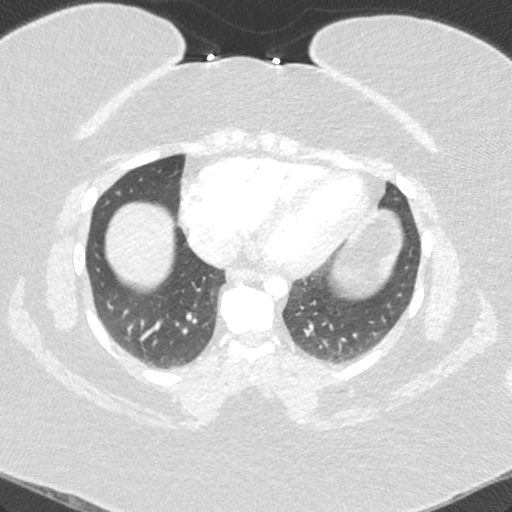
[im 163/419  soft-tissue]
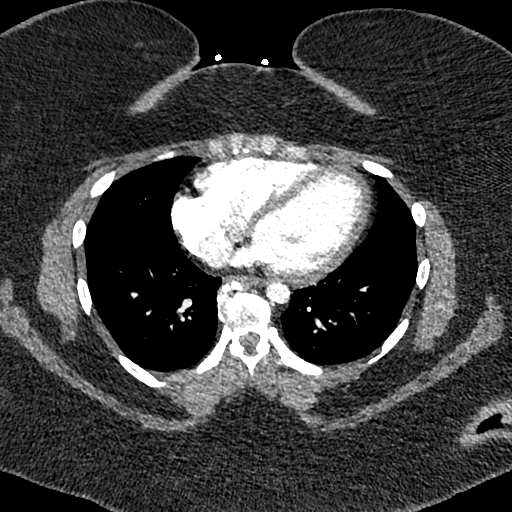
[im 186/419  lung]
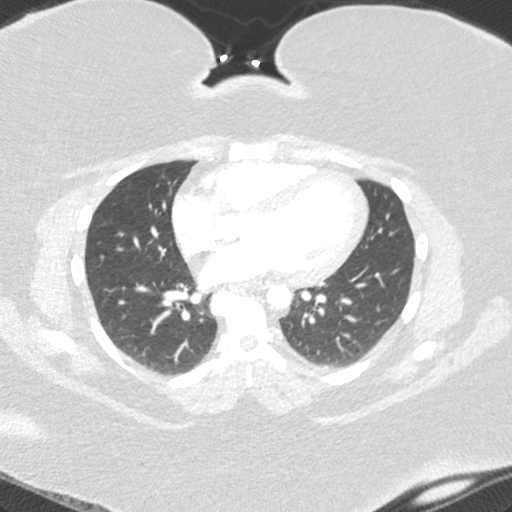
[im 210/419  soft-tissue]
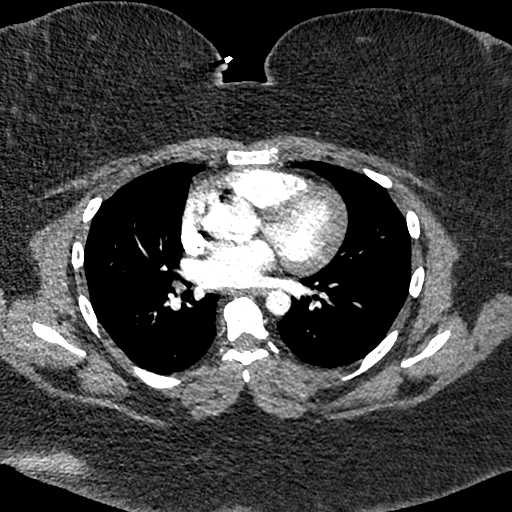
[im 233/419  lung]
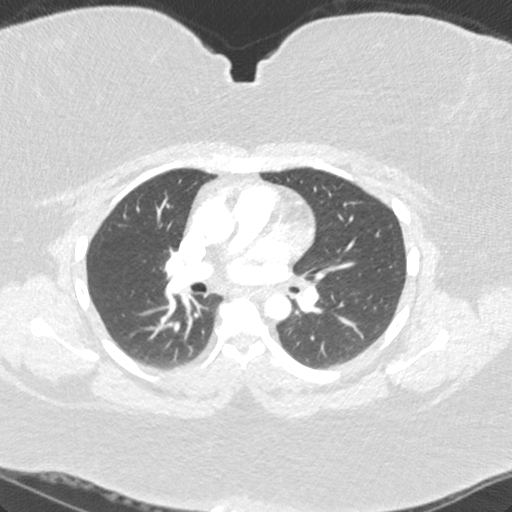
[im 256/419  soft-tissue]
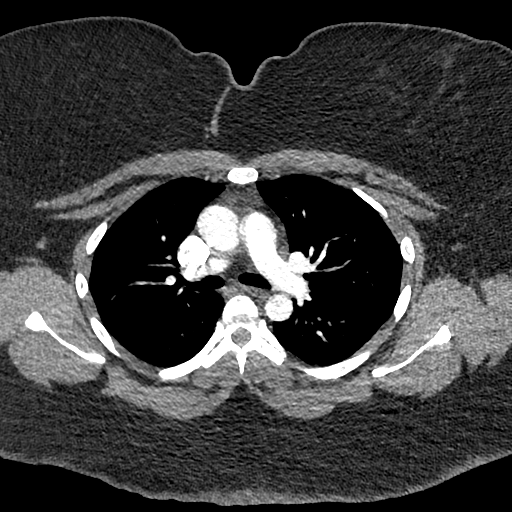
[im 279/419  lung]
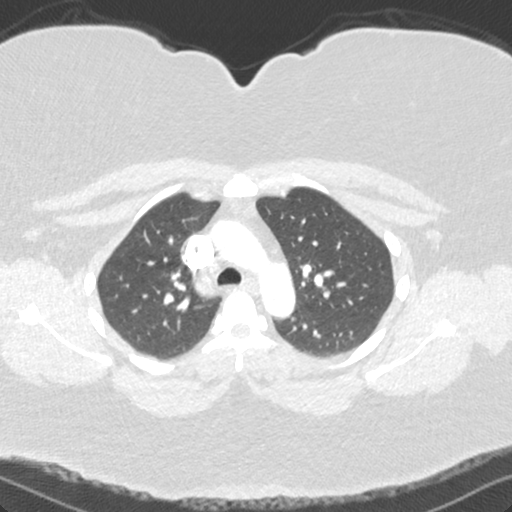
[im 326/419  soft-tissue]
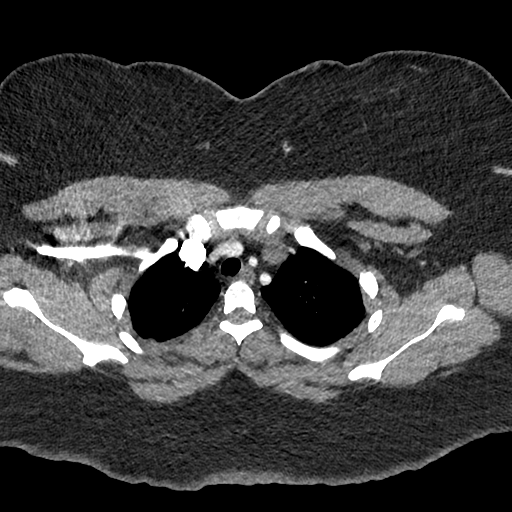
[im 349/419  lung]
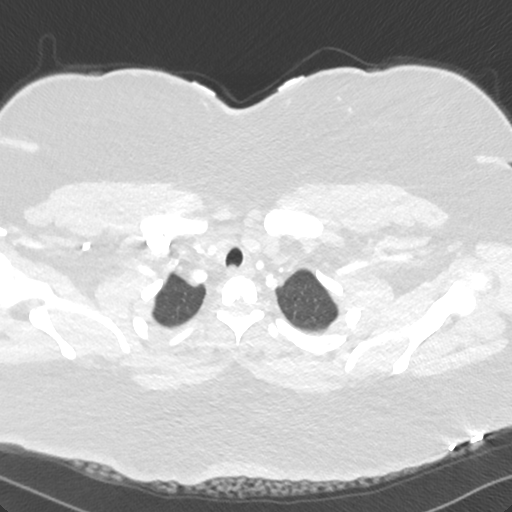
[im 372/419  soft-tissue]
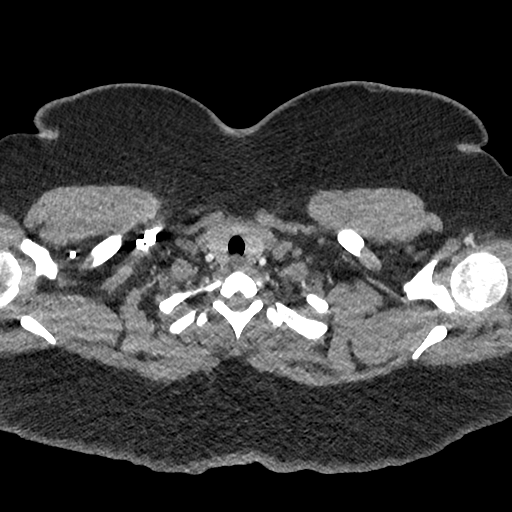
[im 395/419  lung]
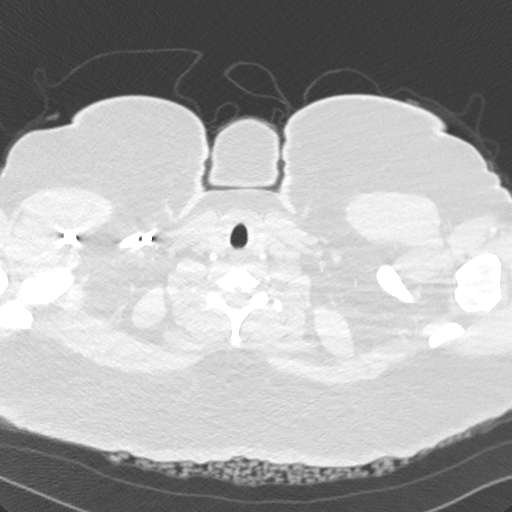

[Series 9: cor · coronal · 0.59mm/px · 3 of 122 slices shown]
[im 31/122  soft-tissue]
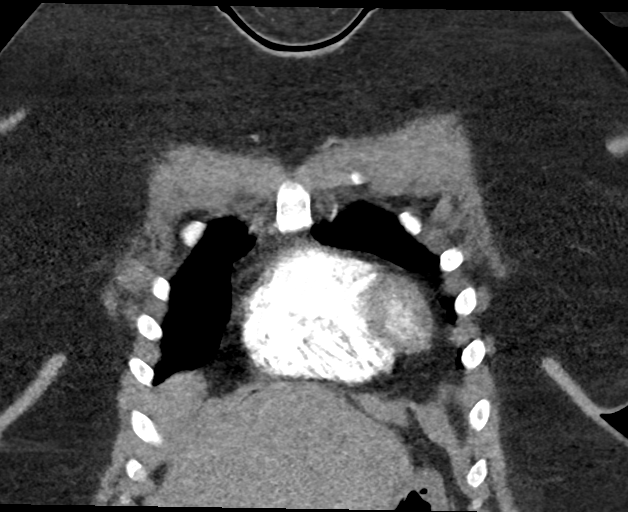
[im 61/122  soft-tissue]
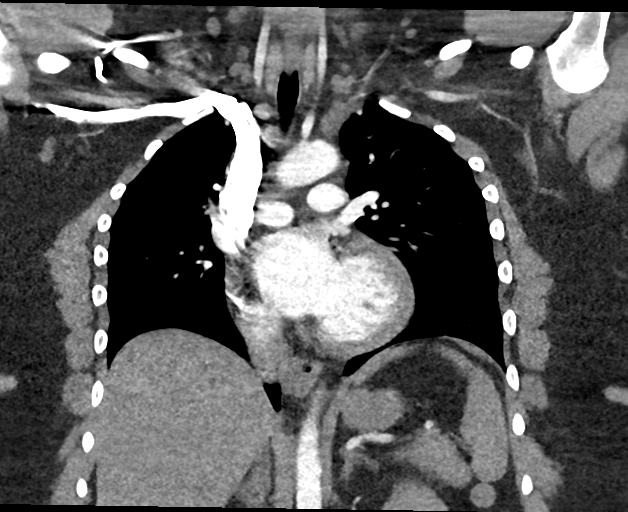
[im 91/122  soft-tissue]
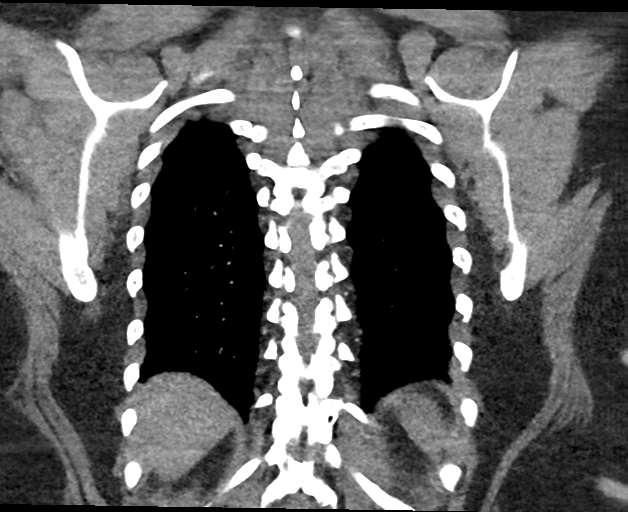

[18 of 46 positions shown; findings below may reference images not displayed]

FINDINGS: Cardiovascular: There are linear filling defects seen within
multiple pulmonary arteries, particularly in the lower lobes.
However, similar appearance is seen in the adjacent pulmonary veins
and these appear linear and artifactual on coronal reconstructed
images. These are likely artifactual related to body habitus. I see
no pulmonary embolus to the segmental level. Heart is normal size.
Aorta is normal caliber.

Mediastinum/Nodes: No mediastinal, hilar, or axillary adenopathy.
Trachea and esophagus are unremarkable. Small hiatal hernia. Thyroid
unremarkable.

Lungs/Pleura: Lungs are clear. No focal airspace opacities or
suspicious nodules. No effusions.

Upper Abdomen: Imaging into the upper abdomen shows no acute
findings.

Musculoskeletal: No acute bony abnormality.

Review of the MIP images confirms the above findings.
IMPRESSION: No central pulmonary embolus or embolus to the segmental level.
Artifact noted peripherally.

No acute cardiopulmonary disease.

## 2019-12-07 IMAGING — US US BIOPSY LYMPH NODE
1 series · 13 of 22 positions shown · non-contrast
Comparison: Neck CT-02/04/2019

INDICATION: Patient has recovered from [id number] infection earlier this year with
persistent bilateral cervical lymphadenopathy. Please perform
ultrasound-guided biopsy for tissue diagnostic purposes.

EXAM:
ULTRASOUND-GUIDED LEFT CERVICAL LYMPH NODE BIOPSY
TECHNIQUE: Informed written consent was obtained from the patient after a
discussion of the risks, benefits and alternatives to treatment.
Questions regarding the procedure were encouraged and answered.
Initial ultrasound scanning demonstrated several prominent bilateral
cervical lymph nodes however subjectively the lymph nodes head
decreased in size compared to neck CT performed 03/07/2019. A
dominant left-sided cervical lymph node measuring approximately
x 1.5 cm (image 8), likely correlating with the dominant nodal
conglomeration seen on preceding neck CT image 35, series 3,
previously measuring approximately 2.7 x 1.9 cm, was targeted for
biopsy given lymph node location and sonographic window. An
ultrasound image was saved for documentation purposes. The procedure
was planned. A timeout was performed prior to the initiation of the
procedure.

[Series 1: us biopsy lymph node · 13 of 22 slices shown]
[im 1/22]
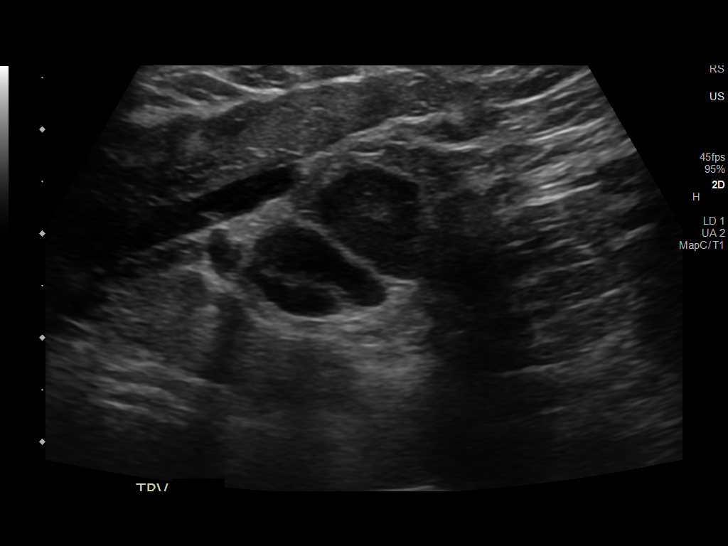
[im 3/22]
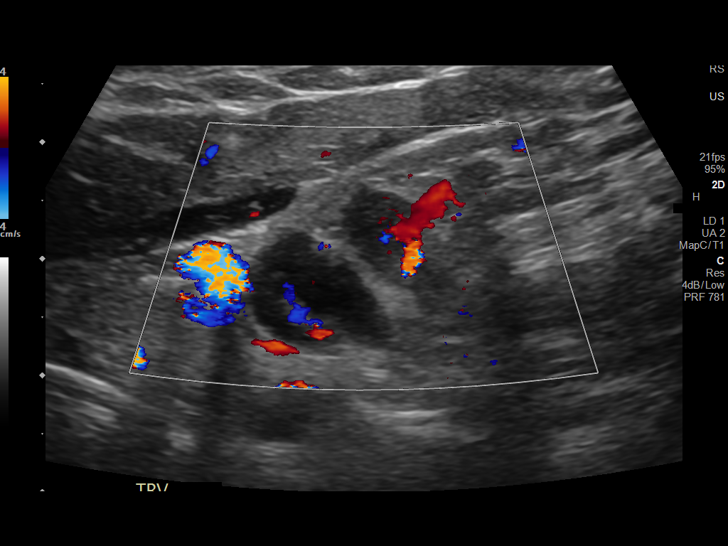
[im 5/22]
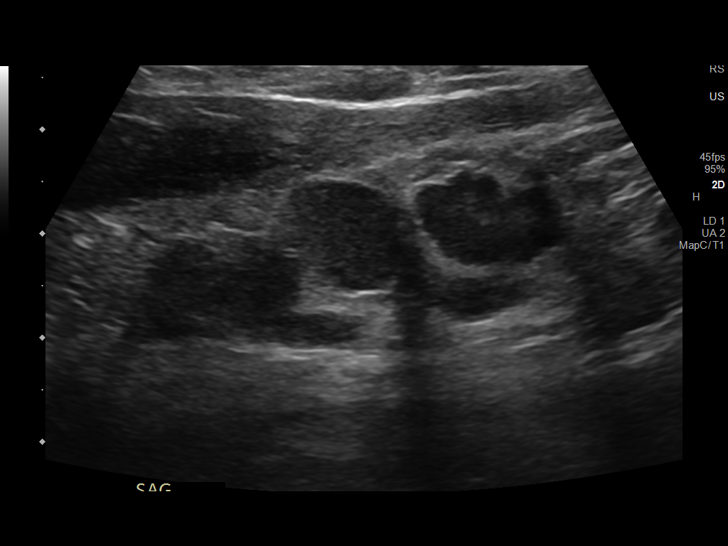
[im 6/22]
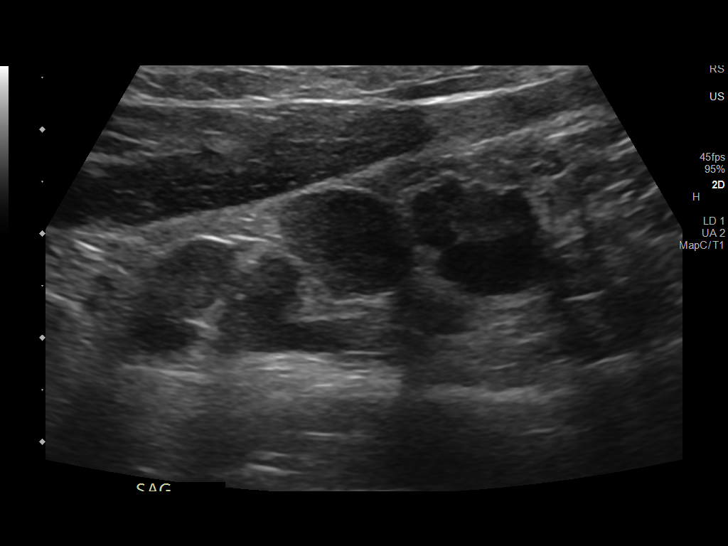
[im 8/22]
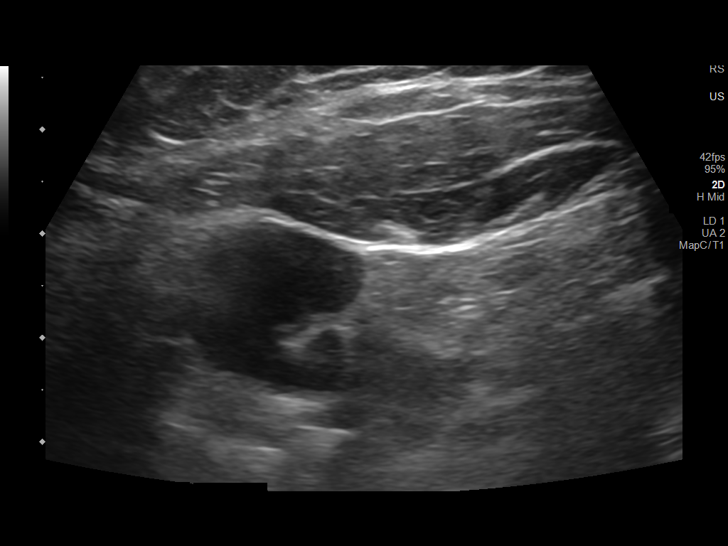
[im 10/22]
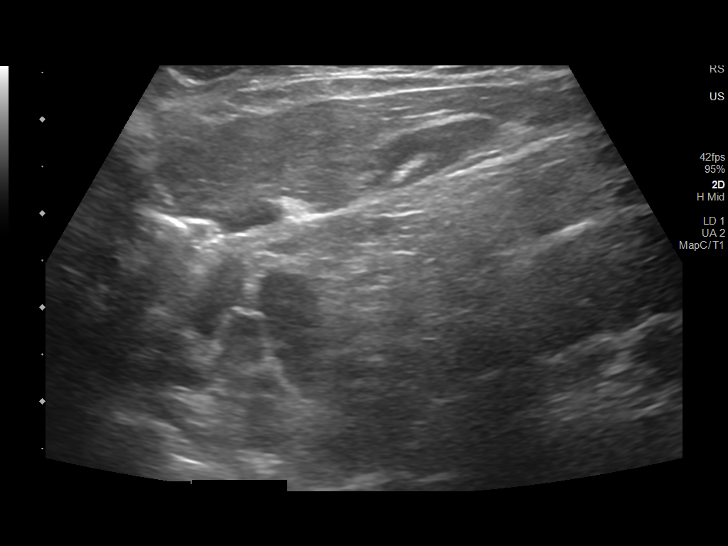
[im 12/22]
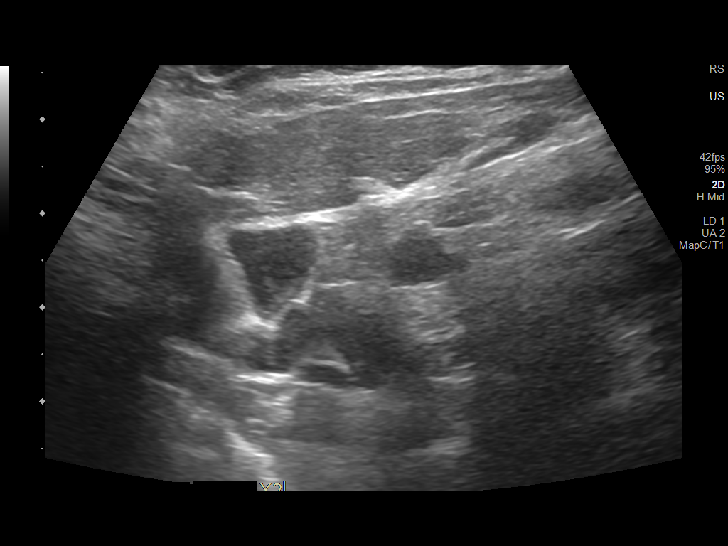
[im 13/22]
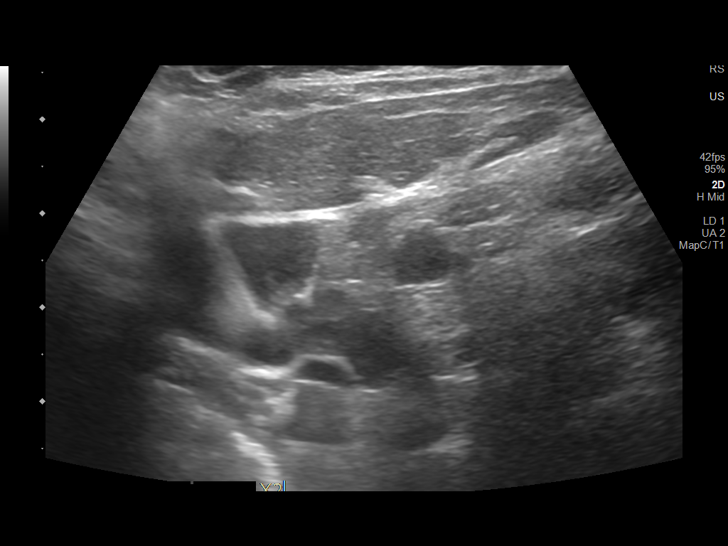
[im 15/22]
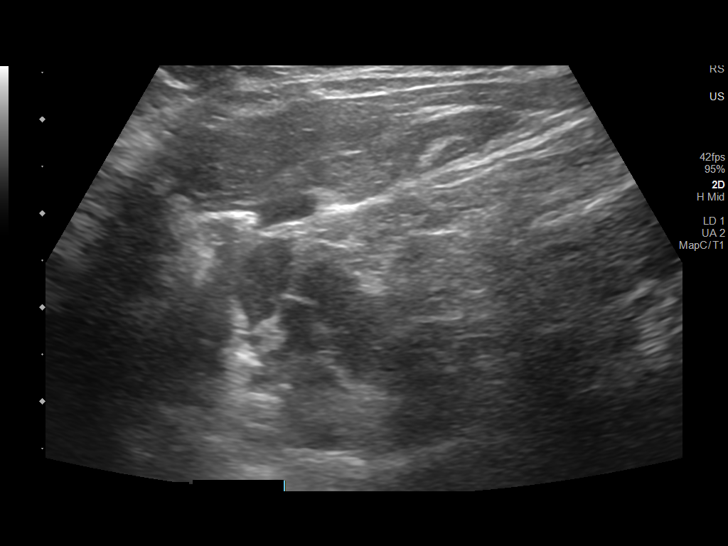
[im 17/22]
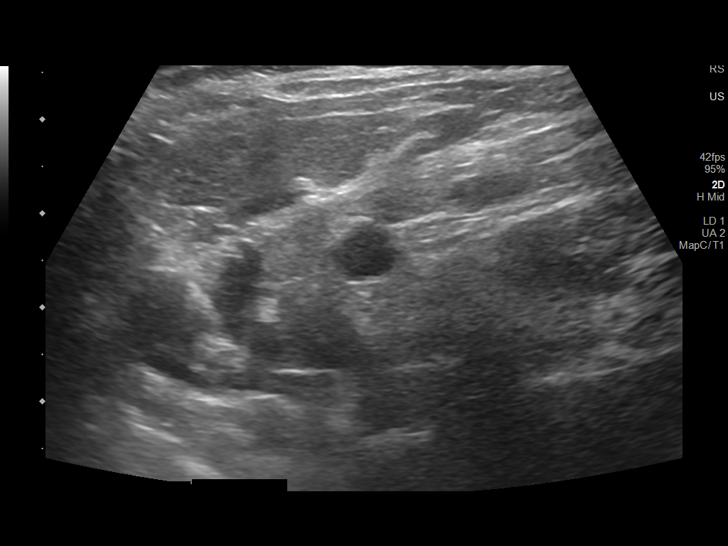
[im 18/22]
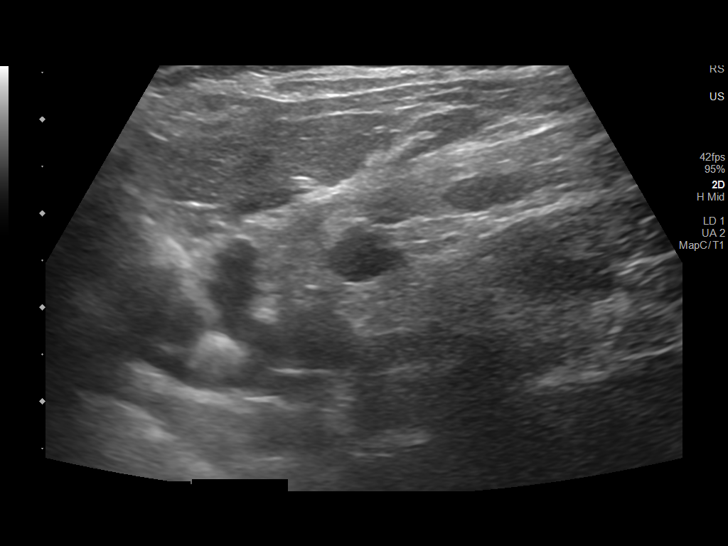
[im 20/22]
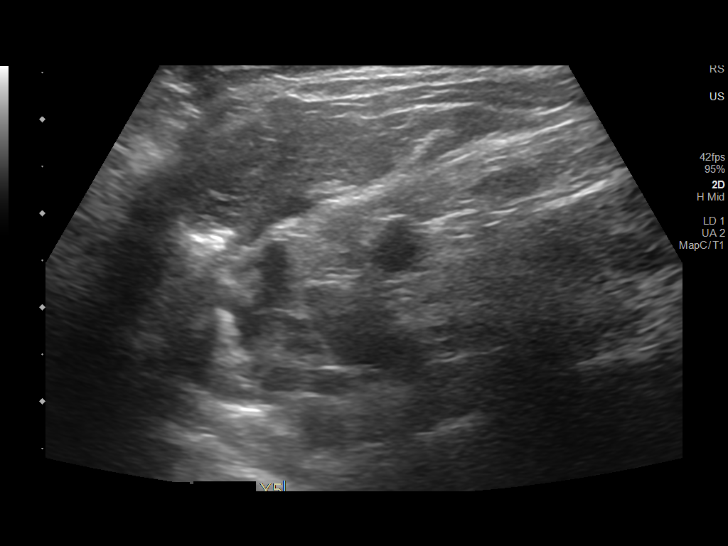
[im 22/22]
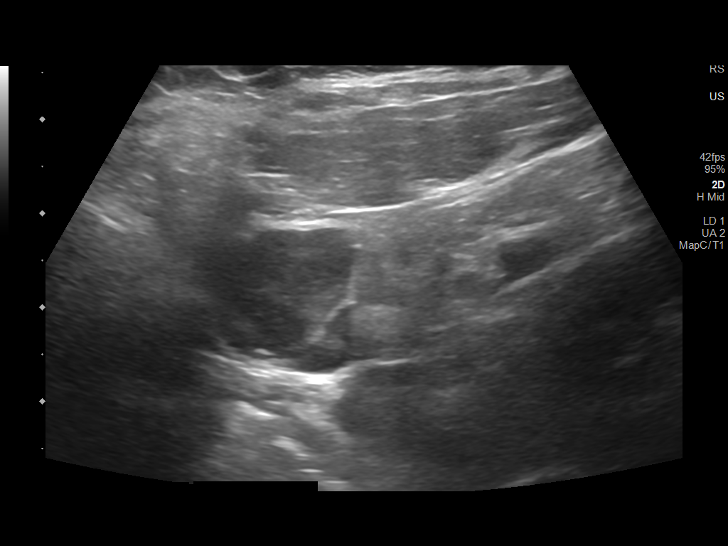

[13 of 22 positions shown; findings below may reference images not displayed]

MEDICATIONS:
None

ANESTHESIA/SEDATION:
Moderate (conscious) sedation was employed during this procedure. A
total of Versed 2 mg and Fentanyl 75 mcg was administered
intravenously.

Moderate Sedation Time: 14 minutes. The patient's level of
consciousness and vital signs were monitored continuously by
radiology nursing throughout the procedure under my direct
supervision.

COMPLICATIONS:
None immediate.
The operative was prepped and draped in the usual sterile fashion,
and a sterile drape was applied covering the operative field. A
timeout was performed prior to the initiation of the procedure.
Local anesthesia was provided with 1% lidocaine with epinephrine.

Under direct ultrasound guidance, an 18 gauge core needle device was
utilized to obtain to obtain 5 core needle biopsies of the dominant
left cervical lymph node.

The samples were placed in saline and submitted to pathology. The
needle was removed and hemostasis was achieved with manual
compression. Post procedure scan was negative for significant
hematoma. A dressing was placed. The patient tolerated the procedure
well without immediate postprocedural complication.
IMPRESSION: Technically successful ultrasound guided biopsy of dominant left
cervical lymph node.

Note, bilateral cervical lymph adenopathy has decreased in size
compared to recent contrast-enhanced neck CT performed 02/04/2019.

## 2021-09-11 ENCOUNTER — Emergency Department (HOSPITAL_BASED_OUTPATIENT_CLINIC_OR_DEPARTMENT_OTHER)
Admission: EM | Admit: 2021-09-11 | Discharge: 2021-09-12 | Disposition: A | Discharge status: Home / Self Care | Payer: Managed Care, Other (non HMO) | Attending: Emergency Medicine | Admitting: Emergency Medicine

## 2021-09-11 ENCOUNTER — Encounter (HOSPITAL_BASED_OUTPATIENT_CLINIC_OR_DEPARTMENT_OTHER): Payer: Self-pay | Admitting: Emergency Medicine

## 2021-09-11 ENCOUNTER — Other Ambulatory Visit: Payer: Self-pay

## 2021-09-11 DIAGNOSIS — M7601 Gluteal tendinitis, right hip: Secondary | ICD-10-CM | POA: Insufficient documentation

## 2021-09-11 DIAGNOSIS — M5431 Sciatica, right side: Secondary | ICD-10-CM | POA: Diagnosis not present

## 2021-09-11 MED ORDER — NAPROXEN 250 MG PO TABS
500.0000 mg | ORAL_TABLET | Freq: Once | ORAL | Status: AC
  Administered 2021-09-11: 500 mg via ORAL
  Filled 2021-09-11: qty 2

## 2021-09-11 MED ORDER — ACETAMINOPHEN 500 MG PO TABS
1000.0000 mg | ORAL_TABLET | Freq: Once | ORAL | Status: AC
  Administered 2021-09-11: 1000 mg via ORAL
  Filled 2021-09-11: qty 2

## 2021-09-11 MED ORDER — LIDOCAINE 5 % EX PTCH
3.0000 | MEDICATED_PATCH | CUTANEOUS | Status: DC
  Administered 2021-09-11: 3 via TRANSDERMAL
  Filled 2021-09-11: qty 3

## 2021-09-11 NOTE — ED Triage Notes (Signed)
R leg pain from thigh to her foot with some numbness to the outside of her foot x 1 week.

## 2021-09-12 ENCOUNTER — Encounter (HOSPITAL_BASED_OUTPATIENT_CLINIC_OR_DEPARTMENT_OTHER): Payer: Self-pay | Admitting: Emergency Medicine

## 2021-09-12 MED ORDER — NAPROXEN 500 MG PO TABS: 500.0000 mg | ORAL_TABLET | Freq: Two times a day (BID) | ORAL | 0 refills | Status: AC

## 2021-09-12 MED ORDER — LIDOCAINE 5 % EX PTCH: 1.0000 | MEDICATED_PATCH | CUTANEOUS | 0 refills | Status: DC

## 2021-09-12 NOTE — ED Provider Notes (Signed)
[facility] EMERGENCY DEPARTMENT Provider Note   CSN: [id number] Arrival date & time: 09/11/21  1908     History  Chief Complaint  Patient presents with   Leg Pain    Courtney Vazquez is a 31 y.o. female.  The history is provided by the patient.  Back Pain Location:  Gluteal region Quality:  Cramping Radiates to:  R posterior upper leg Pain severity:  Moderate Pain is:  Same all the time Onset quality:  Sudden Duration:  1 week Timing:  Constant Progression:  Unchanged Chronicity:  New Context: not recent injury and not twisting   Relieved by:  Nothing Worsened by:  Nothing Ineffective treatments:  None tried Associated symptoms: no abdominal pain, no abdominal swelling, no bladder incontinence, no bowel incontinence, no chest pain, no dysuria, no fever, no headaches, no pelvic pain, no perianal numbness, no tingling, no weakness and no weight loss   Associated symptoms comment:  No bowel or bladder symptoms.  No leg swelling  Risk factors: no hx of cancer       Home Medications Prior to Admission medications   Medication Sig Start Date End Date Taking? Authorizing Provider  Ascorbic Acid (VITAMIN C GUMMIE PO) Take 3 tablets by mouth daily.    [provider]  ELDERBERRY PO Take 2 tablets by mouth daily. Gummies    [provider]  guaiFENesin (MUCINEX) 600 MG 12 hr tablet Take 1,200 mg by mouth 2 (two) times daily.    [provider]  levocetirizine (XYZAL) 5 MG tablet Take 5 mg by mouth daily.    [provider]  Multiple Vitamins-Minerals (ADULT GUMMY PO) Take 2 tablets by mouth daily.    [provider]      Allergies    Robitussin [guaifenesin] and Sudafed [pseudoephedrine hcl]    Review of Systems   Review of Systems  Constitutional:  Negative for fever and weight loss.  HENT:  Negative for facial swelling.   Respiratory:  Negative for wheezing and stridor.   Cardiovascular:  Negative for chest  pain.  Gastrointestinal:  Negative for abdominal pain and bowel incontinence.  Genitourinary:  Negative for bladder incontinence, dysuria and pelvic pain.  Musculoskeletal:  Positive for back pain. Negative for gait problem.  Skin:  Negative for rash.  Neurological:  Negative for tingling, weakness and headaches.  All other systems reviewed and are negative.  Physical Exam Updated Vital Signs BP (!) 159/98   Pulse 97   Temp 98.9 F (37.2 C) (Oral)   Resp 18   Ht 5\' 2"  (1.575 m)   Wt (!) 155.6 kg   LMP 09/09/2021   SpO2 99%   BMI 62.74 kg/m  Physical Exam Vitals and nursing note reviewed.  Constitutional:      General: She is not in acute distress.    Appearance: Normal appearance.  HENT:     Head: Normocephalic and atraumatic.     Nose: Nose normal.  Eyes:     Conjunctiva/sclera: Conjunctivae normal.     Pupils: Pupils are equal, round, and reactive to light.  Cardiovascular:     Rate and Rhythm: Normal rate and regular rhythm.     Pulses: Normal pulses.     Heart sounds: Normal heart sounds.  Pulmonary:     Effort: Pulmonary effort is normal.     Breath sounds: Normal breath sounds.  Abdominal:     General: Bowel sounds are normal.     Palpations: Abdomen is soft.  Tenderness: There is no abdominal tenderness. There is no guarding.  Musculoskeletal:        General: Normal range of motion.     Cervical back: Normal range of motion and neck supple.     Thoracic back: Normal.     Lumbar back: Normal.     Comments: 5/5 strength in the RLE, 3+ DP, cap refill < 2 sec to the digits of the right foot.  Intact sensation to all nerve distributions of the RLE  Skin:    General: Skin is warm and dry.     Capillary Refill: Capillary refill takes less than 2 seconds.  Neurological:     General: No focal deficit present.     Mental Status: She is alert and oriented to person, place, and time.     Deep Tendon Reflexes: Reflexes normal.  Psychiatric:        Mood and  Affect: Mood normal.        Behavior: Behavior normal.    ED Results / Procedures / Treatments   Labs (all labs ordered are listed, but only abnormal results are displayed) Labs Reviewed - No data to display  EKG None  Radiology No results found.  Procedures Procedures    Medications Ordered in ED Medications  lidocaine (LIDODERM) 5 % 3 patch (3 patches Transdermal Patch Applied 09/11/21 2345)  naproxen (NAPROSYN) tablet 500 mg (500 mg Oral Given 09/11/21 2345)  acetaminophen (TYLENOL) tablet 1,000 mg (1,000 mg Oral Given 09/11/21 2345)    ED Course/ Medical Decision Making/ A&P                           Medical Decision Making Buttock pain that radiated to the RLW x 1 week.  No travel.  No OCP.  No trauma   Risk OTC drugs. Prescription drug management. Risk Details: Symptoms are consistent with sciatica.  Patient has not taken anything.  Intact gait and sensation.  Tylenol and NSAIDs and lidoderm and heat therapy.  Follow up with PMD.  Strict return precautions given.     Final Clinical Impression(s) / ED Diagnoses Final diagnoses:  None   Return for intractable cough, coughing up blood, fevers > 100.4 unrelieved by medication, shortness of breath, intractable vomiting, chest pain, shortness of breath, weakness, numbness, changes in speech, facial asymmetry, abdominal pain, passing out, Inability to tolerate liquids or food, cough, altered mental status or any concerns. No signs of systemic illness or infection. The patient is nontoxic-appearing on exam and vital signs are within normal limits.  I have reviewed the triage vital signs and the nursing notes. Pertinent labs & imaging results that were available during my care of the patient were reviewed by me and considered in my medical decision making (see chart for details). After history, exam, and medical workup I feel the patient has been appropriately medically screened and is safe for discharge home. Pertinent diagnoses  were discussed with the patient. Patient was given return precautions.  Rx / DC Orders ED Discharge Orders     None         Rey Dansby, MD 09/12/21 0020

## 2021-11-08 DIAGNOSIS — F411 Generalized anxiety disorder: Secondary | ICD-10-CM | POA: Diagnosis not present

## 2021-11-24 DIAGNOSIS — F411 Generalized anxiety disorder: Secondary | ICD-10-CM | POA: Diagnosis not present

## 2021-11-25 DIAGNOSIS — Z6841 Body Mass Index (BMI) 40.0 and over, adult: Secondary | ICD-10-CM | POA: Diagnosis not present

## 2021-11-25 DIAGNOSIS — A568 Sexually transmitted chlamydial infection of other sites: Secondary | ICD-10-CM | POA: Diagnosis not present

## 2021-11-25 DIAGNOSIS — N898 Other specified noninflammatory disorders of vagina: Secondary | ICD-10-CM | POA: Diagnosis not present

## 2021-11-25 DIAGNOSIS — Z8619 Personal history of other infectious and parasitic diseases: Secondary | ICD-10-CM | POA: Diagnosis not present

## 2021-12-08 DIAGNOSIS — F411 Generalized anxiety disorder: Secondary | ICD-10-CM | POA: Diagnosis not present

## 2021-12-22 DIAGNOSIS — F411 Generalized anxiety disorder: Secondary | ICD-10-CM | POA: Diagnosis not present

## 2022-02-14 ENCOUNTER — Emergency Department (HOSPITAL_BASED_OUTPATIENT_CLINIC_OR_DEPARTMENT_OTHER): Payer: BC Managed Care – PPO

## 2022-02-14 ENCOUNTER — Encounter (HOSPITAL_BASED_OUTPATIENT_CLINIC_OR_DEPARTMENT_OTHER): Payer: Self-pay | Admitting: Emergency Medicine

## 2022-02-14 ENCOUNTER — Other Ambulatory Visit: Payer: Self-pay

## 2022-02-14 ENCOUNTER — Emergency Department (HOSPITAL_BASED_OUTPATIENT_CLINIC_OR_DEPARTMENT_OTHER)
Admission: EM | Admit: 2022-02-14 | Discharge: 2022-02-15 | Disposition: A | Discharge status: Home / Self Care | Payer: BC Managed Care – PPO | Attending: Emergency Medicine | Admitting: Emergency Medicine

## 2022-02-14 DIAGNOSIS — R072 Precordial pain: Secondary | ICD-10-CM | POA: Diagnosis present

## 2022-02-14 DIAGNOSIS — K30 Functional dyspepsia: Secondary | ICD-10-CM | POA: Diagnosis not present

## 2022-02-14 DIAGNOSIS — R0789 Other chest pain: Secondary | ICD-10-CM | POA: Insufficient documentation

## 2022-02-14 HISTORY — DX: Diaphragmatic hernia without obstruction or gangrene: K44.9

## 2022-02-14 HISTORY — DX: Anxiety disorder, unspecified: F41.9

## 2022-02-14 LAB — CBC
HCT: 41.3 % (ref 36.0–46.0)
Hemoglobin: 12.9 g/dL (ref 12.0–15.0)
MCH: 28.3 pg (ref 26.0–34.0)
MCHC: 31.2 g/dL (ref 30.0–36.0)
MCV: 90.6 fL (ref 80.0–100.0)
Platelets: 294 10*3/uL (ref 150–400)
RBC: 4.56 MIL/uL (ref 3.87–5.11)
RDW: 12.6 % (ref 11.5–15.5)
WBC: 10.2 10*3/uL (ref 4.0–10.5)
nRBC: 0 % (ref 0.0–0.2)

## 2022-02-14 LAB — TROPONIN I (HIGH SENSITIVITY): Troponin I (High Sensitivity): 4 ng/L (ref <18)

## 2022-02-14 LAB — BASIC METABOLIC PANEL
Anion gap: 7 (ref 5–15)
BUN: 13 mg/dL (ref 6–20)
CO2: 22 mmol/L (ref 22–32)
Calcium: 8.7 mg/dL — ABNORMAL LOW (ref 8.9–10.3)
Chloride: 107 mmol/L (ref 98–111)
Creatinine, Ser: 0.76 mg/dL (ref 0.44–1.00)
GFR, Estimated: >60 mL/min (ref >60)
Glucose, Bld: 116 mg/dL — ABNORMAL HIGH (ref 70–99)
Potassium: 4.2 mmol/L (ref 3.5–5.1)
Sodium: 136 mmol/L (ref 135–145)

## 2022-02-14 LAB — PREGNANCY, URINE: Preg Test, Ur: NEGATIVE

## 2022-02-14 MED ORDER — LIDOCAINE VISCOUS HCL 2 % MT SOLN
15.0000 mL | Freq: Once | OROMUCOSAL | Status: AC
  Administered 2022-02-15: 15 mL via ORAL
  Filled 2022-02-14: qty 15

## 2022-02-14 MED ORDER — FAMOTIDINE 20 MG PO TABS
40.0000 mg | ORAL_TABLET | Freq: Once | ORAL | Status: AC
  Administered 2022-02-15: 40 mg via ORAL
  Filled 2022-02-14: qty 2

## 2022-02-14 MED ORDER — ALUM & MAG HYDROXIDE-SIMETH 200-200-20 MG/5ML PO SUSP
30.0000 mL | Freq: Once | ORAL | Status: AC
  Administered 2022-02-15: 30 mL via ORAL
  Filled 2022-02-14: qty 30

## 2022-02-14 MED ORDER — KETOROLAC TROMETHAMINE 60 MG/2ML IM SOLN
60.0000 mg | Freq: Once | INTRAMUSCULAR | Status: AC
  Administered 2022-02-15: 60 mg via INTRAMUSCULAR
  Filled 2022-02-14: qty 2

## 2022-02-14 NOTE — ED Notes (Signed)
IV start attempted x1, unsuccessful.  Primary RN made aware.   Pt transported to imaging.

## 2022-02-14 NOTE — ED Triage Notes (Signed)
Pt POV c/o chest discomfort x 'couple of weeks.'  Worsening today, described as "pulling." Intermittent radiation to back, not currently.   Recently taking antibiotics for sinus infection.

## 2022-02-14 NOTE — ED Provider Notes (Signed)
[facility] EMERGENCY DEPARTMENT Provider Note   CSN: [id number] Arrival date & time: 02/14/22  2158     History  Chief Complaint  Patient presents with   Chest Pain    Courtney Vazquez is a 31 y.o. female.  Patient as above with significant medical history as below, including anxiety, hiatal hernia who presents to the ED with complaint of chest pain.  Patient reports symptoms ongoing approximately 2 weeks.  Described as midsternal, chest pulling sensation.  Worsen when twisting her torso or trying to lift her self into her vehicle.  Typically experiences symptoms when driving home from work and when she is home the symptoms usually dissipate.  No dyspnea, nausea or vomiting.  Does report intermittent burning sensation in her chest after eating and swallowing.  No change in bowel or bladder function, no nausea or vomiting, no recent alcohol or illicit drug use, no fevers or chills, no sick contacts or recent travel.  No medications prior to arrival  Reports symptoms improved since the onset.     Past Medical History:  Diagnosis Date   Anxiety    Hiatal hernia     History reviewed. No pertinent surgical history.   The history is provided by the patient and a parent. No language interpreter was used.  Chest Pain Associated symptoms: no abdominal pain, no back pain, no cough, no dysphagia, no fever, no headache, no nausea, no palpitations and no shortness of breath        Home Medications Prior to Admission medications   Medication Sig Start Date End Date Taking? Authorizing Provider  pantoprazole (PROTONIX) 20 MG tablet Take 1 tablet (20 mg total) by mouth daily for 14 days. 02/15/22 03/01/22 Yes Wynona Dove A, DO  sucralfate (CARAFATE) 1 g tablet Take 1 tablet (1 g total) by mouth 4 (four) times daily -  with meals and at bedtime for 7 days. 02/15/22 02/22/22 Yes Jeanell Sparrow, DO  Ascorbic Acid (VITAMIN C GUMMIE PO) Take 3 tablets by mouth daily.    [provider]  ELDERBERRY PO Take 2 tablets by mouth daily. Gummies    [provider]  guaiFENesin (MUCINEX) 600 MG 12 hr tablet Take 1,200 mg by mouth 2 (two) times daily.    [provider]  levocetirizine (XYZAL) 5 MG tablet Take 5 mg by mouth daily.    [provider]  lidocaine (LIDODERM) 5 % Place 1 patch onto the skin daily. Remove & Discard patch within 12 hours or as directed by MD 09/12/21   Randal Buba, April, MD  Multiple Vitamins-Minerals (ADULT GUMMY PO) Take 2 tablets by mouth daily.    [provider]  naproxen (NAPROSYN) 500 MG tablet Take 1 tablet (500 mg total) by mouth 2 (two) times daily with a meal. 09/12/21   Palumbo, April, MD      Allergies    Robitussin [guaifenesin] and Sudafed [pseudoephedrine hcl]    Review of Systems   Review of Systems  Constitutional:  Negative for activity change and fever.  HENT:  Negative for facial swelling and trouble swallowing.   Eyes:  Negative for discharge and redness.  Respiratory:  Negative for cough and shortness of breath.   Cardiovascular:  Positive for chest pain. Negative for palpitations.  Gastrointestinal:  Negative for abdominal pain and nausea.  Genitourinary:  Negative for dysuria and flank pain.  Musculoskeletal:  Negative for back pain and gait problem.  Skin:  Negative for pallor and rash.  Neurological:  Negative for syncope and headaches.    Physical Exam Updated Vital Signs BP (!) 149/94   Pulse 83   Temp 98.3 F (36.8 C) (Oral)   Resp 18   Ht 5' 2.5" (1.588 m)   Wt (!) 161 kg   LMP 02/10/2022 (Exact Date)   SpO2 98%   BMI 63.90 kg/m  Physical Exam Vitals and nursing note reviewed.  Constitutional:      General: She is not in acute distress.    Appearance: Normal appearance. She is obese.  HENT:     Head: Normocephalic and atraumatic.     Right Ear: External ear normal.     Left Ear: External ear normal.     Nose: Nose normal.     Mouth/Throat:     Mouth:  Mucous membranes are moist.  Eyes:     General: No scleral icterus.       Right eye: No discharge.        Left eye: No discharge.  Cardiovascular:     Rate and Rhythm: Normal rate and regular rhythm.     Pulses: Normal pulses.     Heart sounds: Normal heart sounds.     No S3 or S4 sounds.  Pulmonary:     Effort: Pulmonary effort is normal. No respiratory distress.     Breath sounds: Normal breath sounds.  Chest:    Abdominal:     General: Abdomen is flat.     Tenderness: There is no abdominal tenderness.  Musculoskeletal:        General: Normal range of motion.     Cervical back: Normal range of motion.     Right lower leg: No edema.     Left lower leg: No edema.  Skin:    General: Skin is warm and dry.     Capillary Refill: Capillary refill takes less than 2 seconds.  Neurological:     Mental Status: She is alert.  Psychiatric:        Mood and Affect: Mood normal.        Behavior: Behavior normal.     ED Results / Procedures / Treatments   Labs (all labs ordered are listed, but only abnormal results are displayed) Labs Reviewed  BASIC METABOLIC PANEL - Abnormal; Notable for the following components:      Result Value   Glucose, Bld 116 (*)    Calcium 8.7 (*)    All other components within normal limits  CBC  PREGNANCY, URINE  TROPONIN I (HIGH SENSITIVITY)  TROPONIN I (HIGH SENSITIVITY)    EKG EKG Interpretation  Date/Time:  Monday February 14 2022 22:13:18 EDT Ventricular Rate:  77 PR Interval:  150 QRS Duration: 80 QT Interval:  384 QTC Calculation: 434 R Axis:   27 Text Interpretation: Normal sinus rhythm Cannot rule out Anterior infarct , age undetermined Abnormal ECG When compared with ECG of 14-Feb-2019 23:25, No significant change since last tracing Confirmed by Dorie Rank [phone]) on 02/14/2022 10:20:14 PM  Radiology DG Chest 2 View  Result Date: 02/14/2022 CLINICAL DATA:  Chest pain for a few weeks, initial encounter EXAM: CHEST - 2 VIEW  COMPARISON:  02/14/2019 FINDINGS: The heart size and mediastinal contours are within normal limits. Both lungs are clear. The visualized skeletal structures are unremarkable. IMPRESSION: No active cardiopulmonary disease. Electronically Signed   By: Inez Catalina M.D.   On: 02/14/2022 23:05    Procedures Procedures    Medications Ordered in ED Medications  famotidine (PEPCID) tablet 40 mg (40 mg Oral Given 02/15/22 0004)  alum & mag hydroxide-simeth (MAALOX/MYLANTA) 200-200-20 MG/5ML suspension 30 mL (30 mLs Oral Given 02/15/22 0004)    And  lidocaine (XYLOCAINE) 2 % viscous mouth solution 15 mL (15 mLs Oral Given 02/15/22 0004)  ketorolac (TORADOL) injection 60 mg (60 mg Intramuscular Given 02/15/22 0003)    ED Course/ Medical Decision Making/ A&P                           Medical Decision Making Amount and/or Complexity of Data Reviewed Labs: ordered. Radiology: ordered.  Risk OTC drugs. Prescription drug management.   This patient presents to the ED with chief complaint(s) of cp with pertinent past medical history of anxiety/hiatal hernia which further complicates the presenting complaint. The complaint involves an extensive differential diagnosis and also carries with it a high risk of complications and morbidity.    Differential includes all life-threatening causes for chest pain. This includes but is not exclusive to acute coronary syndrome, aortic dissection, pulmonary embolism, cardiac tamponade, community-acquired pneumonia, pericarditis, musculoskeletal chest wall pain, etc.  . Serious etiologies were considered.   The initial plan is to screening labs ordered in triage   Additional history obtained: Additional history obtained from family Records reviewed  prior ed visits, prior labs/imaging  Independent labs interpretation:  The following labs were independently interpreted:  Troponin was negative Cbc/bmp wnl Preg neg Ekg neg  Independent visualization of  imaging: - I independently visualized the following imaging with scope of interpretation limited to determining acute life threatening conditions related to emergency care: cxr, which revealed no acute process  Cardiac monitoring was reviewed and interpreted by myself which shows nsr  Treatment and Reassessment: Gi cocktail toradol > symptoms resolved at this time  Consultation: - Consulted or discussed management/test interpretation w/ external professional: na  Consideration for admission or further workup: Admission was considered   The patient's chest pain is not suggestive of pulmonary embolus, cardiac ischemia, aortic dissection, pericarditis, myocarditis, pulmonary embolism, pneumothorax, pneumonia, Zoster, or esophageal perforation, or other serious etiology.  Historically not abrupt in onset, tearing or ripping, pulses symmetric. EKG nonspecific for ischemia/infarction. No dysrhythmias, brugada, WPW, prolonged QT noted.    Troponin negative x1. CXR reviewed. Labs without demonstration of acute pathology unless otherwise noted above. Low HEART Score: 0-3 points (0.9-1.7% risk of MACE).   Would favor atypical chest pain, possible indigestion or symptoms a/w her hiatal hernia. Start on ppi, carafate, apap prn, dietary changes, f/u with pcp  Given the extremely low risk of these diagnoses further testing and evaluation for these possibilities does not appear to be indicated at this time. Patient in no distress and overall condition improved here in the ED. Detailed discussions were had with the patient regarding current findings, and need for close f/u with PCP or on call doctor. The patient has been instructed to return immediately if the symptoms worsen in any way for re-evaluation. Patient verbalized understanding and is in agreement with current care plan. All questions answered prior to discharge.   Social Determinants of health: Social History   Tobacco Use   Smoking status:  Never  Substance Use Topics   Alcohol use: Yes   Drug use: No            Final Clinical Impression(s) / ED Diagnoses Final diagnoses:  Atypical chest pain  Indigestion    Rx / DC Orders ED Discharge Orders  Ordered    sucralfate (CARAFATE) 1 g tablet  3 times daily with meals & bedtime        02/15/22 0102    pantoprazole (PROTONIX) 20 MG tablet  Daily        02/15/22 0102              Jeanell Sparrow, DO 02/15/22 0104

## 2022-02-15 ENCOUNTER — Encounter (HOSPITAL_BASED_OUTPATIENT_CLINIC_OR_DEPARTMENT_OTHER): Payer: Self-pay | Admitting: Emergency Medicine

## 2022-02-15 MED ORDER — SUCRALFATE 1 G PO TABS: 1.0000 g | ORAL_TABLET | Freq: Three times a day (TID) | ORAL | 0 refills | Status: DC

## 2022-02-15 MED ORDER — PANTOPRAZOLE SODIUM 20 MG PO TBEC: 20.0000 mg | DELAYED_RELEASE_TABLET | Freq: Every day | ORAL | 0 refills | Status: AC

## 2022-02-15 NOTE — ED Notes (Signed)
VRBO from EDP no repeat trop

## 2022-02-15 NOTE — Discharge Instructions (Signed)
It was a pleasure caring for you today in the emergency department.  Additional Instructions  No alcohol.  No caffeine, aspirin, or cigarettes. Follow bland diet   Please return to the emergency department immediately for any new or concerning symptoms, or if you get worse.

## 2022-10-25 DIAGNOSIS — R300 Dysuria: Secondary | ICD-10-CM | POA: Diagnosis not present

## 2022-10-25 DIAGNOSIS — Z6841 Body Mass Index (BMI) 40.0 and over, adult: Secondary | ICD-10-CM | POA: Diagnosis not present

## 2022-10-25 DIAGNOSIS — R21 Rash and other nonspecific skin eruption: Secondary | ICD-10-CM | POA: Diagnosis not present

## 2022-10-25 DIAGNOSIS — Z113 Encounter for screening for infections with a predominantly sexual mode of transmission: Secondary | ICD-10-CM | POA: Diagnosis not present

## 2022-10-26 DIAGNOSIS — F411 Generalized anxiety disorder: Secondary | ICD-10-CM | POA: Diagnosis not present

## 2022-11-14 DIAGNOSIS — F411 Generalized anxiety disorder: Secondary | ICD-10-CM | POA: Diagnosis not present

## 2022-11-20 ENCOUNTER — Emergency Department (HOSPITAL_BASED_OUTPATIENT_CLINIC_OR_DEPARTMENT_OTHER)
Admission: EM | Admit: 2022-11-20 | Discharge: 2022-11-20 | Disposition: A | Discharge status: Home / Self Care | Payer: No Typology Code available for payment source | Attending: Emergency Medicine | Admitting: Emergency Medicine

## 2022-11-20 ENCOUNTER — Other Ambulatory Visit: Payer: Self-pay

## 2022-11-20 ENCOUNTER — Encounter (HOSPITAL_BASED_OUTPATIENT_CLINIC_OR_DEPARTMENT_OTHER): Payer: Self-pay | Admitting: Emergency Medicine

## 2022-11-20 DIAGNOSIS — Y92410 Unspecified street and highway as the place of occurrence of the external cause: Secondary | ICD-10-CM | POA: Insufficient documentation

## 2022-11-20 DIAGNOSIS — S00432A Contusion of left ear, initial encounter: Secondary | ICD-10-CM | POA: Diagnosis not present

## 2022-11-20 DIAGNOSIS — S199XXA Unspecified injury of neck, initial encounter: Secondary | ICD-10-CM | POA: Diagnosis not present

## 2022-11-20 DIAGNOSIS — S0990XA Unspecified injury of head, initial encounter: Secondary | ICD-10-CM | POA: Insufficient documentation

## 2022-11-20 DIAGNOSIS — S86211A Strain of muscle(s) and tendon(s) of anterior muscle group at lower leg level, right leg, initial encounter: Secondary | ICD-10-CM | POA: Diagnosis not present

## 2022-11-20 DIAGNOSIS — S8991XA Unspecified injury of right lower leg, initial encounter: Secondary | ICD-10-CM | POA: Diagnosis present

## 2022-11-20 DIAGNOSIS — S86811A Strain of other muscle(s) and tendon(s) at lower leg level, right leg, initial encounter: Secondary | ICD-10-CM

## 2022-11-20 MED ORDER — METHOCARBAMOL 500 MG PO TABS: 500.0000 mg | ORAL_TABLET | Freq: Two times a day (BID) | ORAL | 0 refills | Status: DC | PRN

## 2022-11-20 MED ORDER — IBUPROFEN 800 MG PO TABS: 800.0000 mg | ORAL_TABLET | Freq: Three times a day (TID) | ORAL | 0 refills | Status: DC | PRN
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
## 2022-11-22 DIAGNOSIS — Z6841 Body Mass Index (BMI) 40.0 and over, adult: Secondary | ICD-10-CM | POA: Diagnosis not present

## 2022-11-22 DIAGNOSIS — Z1331 Encounter for screening for depression: Secondary | ICD-10-CM | POA: Diagnosis not present

## 2022-11-22 DIAGNOSIS — Z0000 Encounter for general adult medical examination without abnormal findings: Secondary | ICD-10-CM | POA: Diagnosis not present

## 2022-11-27 ENCOUNTER — Emergency Department (HOSPITAL_BASED_OUTPATIENT_CLINIC_OR_DEPARTMENT_OTHER): Payer: BC Managed Care – PPO

## 2022-11-27 ENCOUNTER — Emergency Department (HOSPITAL_BASED_OUTPATIENT_CLINIC_OR_DEPARTMENT_OTHER)
Admission: EM | Admit: 2022-11-27 | Discharge: 2022-11-27 | Disposition: A | Discharge status: Home / Self Care | Payer: BC Managed Care – PPO | Attending: Emergency Medicine | Admitting: Emergency Medicine

## 2022-11-27 ENCOUNTER — Encounter (HOSPITAL_BASED_OUTPATIENT_CLINIC_OR_DEPARTMENT_OTHER): Payer: Self-pay

## 2022-11-27 DIAGNOSIS — Y92410 Unspecified street and highway as the place of occurrence of the external cause: Secondary | ICD-10-CM | POA: Diagnosis not present

## 2022-11-27 DIAGNOSIS — M542 Cervicalgia: Secondary | ICD-10-CM | POA: Diagnosis not present

## 2022-11-27 DIAGNOSIS — M25512 Pain in left shoulder: Secondary | ICD-10-CM | POA: Diagnosis not present

## 2022-11-27 NOTE — Discharge Instructions (Signed)
I would take ibuprofen and Robaxin as prescribed by Dr. Maurice Small week.  This should help with your discomfort.  Follow-up with orthopedics.  Please return if symptoms worsen.
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
## 2022-12-08 DIAGNOSIS — M25512 Pain in left shoulder: Secondary | ICD-10-CM | POA: Diagnosis not present

## 2022-12-27 DIAGNOSIS — L309 Dermatitis, unspecified: Secondary | ICD-10-CM | POA: Diagnosis not present

## 2022-12-27 DIAGNOSIS — Z6841 Body Mass Index (BMI) 40.0 and over, adult: Secondary | ICD-10-CM | POA: Diagnosis not present

## 2022-12-29 DIAGNOSIS — M25512 Pain in left shoulder: Secondary | ICD-10-CM | POA: Diagnosis not present

## 2023-01-18 DIAGNOSIS — E785 Hyperlipidemia, unspecified: Secondary | ICD-10-CM | POA: Diagnosis not present

## 2023-01-18 DIAGNOSIS — Z6841 Body Mass Index (BMI) 40.0 and over, adult: Secondary | ICD-10-CM | POA: Diagnosis not present

## 2023-01-18 DIAGNOSIS — R300 Dysuria: Secondary | ICD-10-CM | POA: Diagnosis not present

## 2023-03-02 DIAGNOSIS — J40 Bronchitis, not specified as acute or chronic: Secondary | ICD-10-CM | POA: Diagnosis not present

## 2023-03-02 DIAGNOSIS — Z6841 Body Mass Index (BMI) 40.0 and over, adult: Secondary | ICD-10-CM | POA: Diagnosis not present

## 2023-04-12 DIAGNOSIS — N3001 Acute cystitis with hematuria: Secondary | ICD-10-CM | POA: Diagnosis not present

## 2023-09-11 ENCOUNTER — Emergency Department (HOSPITAL_BASED_OUTPATIENT_CLINIC_OR_DEPARTMENT_OTHER)
Admission: EM | Admit: 2023-09-11 | Discharge: 2023-09-12 | Disposition: A | Discharge status: Home / Self Care | Attending: Emergency Medicine | Admitting: Emergency Medicine

## 2023-09-11 ENCOUNTER — Other Ambulatory Visit: Payer: Self-pay

## 2023-09-11 ENCOUNTER — Encounter (HOSPITAL_BASED_OUTPATIENT_CLINIC_OR_DEPARTMENT_OTHER): Payer: Self-pay

## 2023-09-11 DIAGNOSIS — K760 Fatty (change of) liver, not elsewhere classified: Secondary | ICD-10-CM | POA: Diagnosis not present

## 2023-09-11 DIAGNOSIS — R110 Nausea: Secondary | ICD-10-CM | POA: Diagnosis not present

## 2023-09-11 DIAGNOSIS — R1012 Left upper quadrant pain: Secondary | ICD-10-CM | POA: Insufficient documentation

## 2023-09-11 DIAGNOSIS — R109 Unspecified abdominal pain: Secondary | ICD-10-CM | POA: Diagnosis not present

## 2023-09-11 DIAGNOSIS — K5730 Diverticulosis of large intestine without perforation or abscess without bleeding: Secondary | ICD-10-CM | POA: Diagnosis not present

## 2023-09-11 LAB — URINALYSIS, ROUTINE W REFLEX MICROSCOPIC
Bilirubin Urine: NEGATIVE
Glucose, UA: NEGATIVE mg/dL
Ketones, ur: NEGATIVE mg/dL
Leukocytes,Ua: NEGATIVE
Nitrite: NEGATIVE
Protein, ur: NEGATIVE mg/dL
Specific Gravity, Urine: >=1.03 (ref 1.005–1.030)
pH: 6 (ref 5.0–8.0)

## 2023-09-11 LAB — COMPREHENSIVE METABOLIC PANEL WITH GFR
ALT: 13 U/L (ref 0–44)
AST: 17 U/L (ref 15–41)
Albumin: 4.3 g/dL (ref 3.5–5.0)
Alkaline Phosphatase: 100 U/L (ref 38–126)
Anion gap: 14 (ref 5–15)
BUN: 18 mg/dL (ref 6–20)
CO2: 23 mmol/L (ref 22–32)
Calcium: 9.6 mg/dL (ref 8.9–10.3)
Chloride: 102 mmol/L (ref 98–111)
Creatinine, Ser: 0.9 mg/dL (ref 0.44–1.00)
GFR, Estimated: >60 mL/min (ref >60)
Glucose, Bld: 86 mg/dL (ref 70–99)
Potassium: 4.1 mmol/L (ref 3.5–5.1)
Sodium: 138 mmol/L (ref 135–145)
Total Bilirubin: 0.3 mg/dL (ref 0.0–1.2)
Total Protein: 8.5 g/dL — ABNORMAL HIGH (ref 6.5–8.1)

## 2023-09-11 LAB — URINALYSIS, MICROSCOPIC (REFLEX)

## 2023-09-11 LAB — CBC
HCT: 42.1 % (ref 36.0–46.0)
Hemoglobin: 13.3 g/dL (ref 12.0–15.0)
MCH: 27.8 pg (ref 26.0–34.0)
MCHC: 31.6 g/dL (ref 30.0–36.0)
MCV: 87.9 fL (ref 80.0–100.0)
Platelets: 316 10*3/uL (ref 150–400)
RBC: 4.79 MIL/uL (ref 3.87–5.11)
RDW: 12.6 % (ref 11.5–15.5)
WBC: 9.9 10*3/uL (ref 4.0–10.5)
nRBC: 0 % (ref 0.0–0.2)

## 2023-09-11 LAB — LIPASE, BLOOD: Lipase: 41 U/L (ref 11–51)

## 2023-09-11 LAB — PREGNANCY, URINE: Preg Test, Ur: NEGATIVE

## 2023-09-11 NOTE — ED Provider Notes (Addendum)
 [place] [facility] Provider Note   CSN: [id number] Arrival date & time: 09/11/23  1836     History  Chief Complaint  Patient presents with   Abdominal Pain    Courtney Vazquez is a 33 y.o. female.  Patient was few month history of left upper quadrant abdominal pain that has become worse and become more consistent.  Intermittent in the past.  But worse over the last couple weeks.  Associated with nausea but no vomiting or diarrhea.  Temp here 98.1 pulse 97 respirations 19 blood pressure 167/100 and oxygen sats were 100%.  Patient's past medical history significant for anxiety hiatal hernia.  Which could be part of the problem.  Patient is never used tobacco products.  Past surgical history is none.  Patient last seen by us  in August 2024.       Home Medications Prior to Admission medications   Medication Sig Start Date End Date Taking? Authorizing Provider  Ascorbic Acid (VITAMIN C GUMMIE PO) Take 3 tablets by mouth daily.    [provider]  ELDERBERRY PO Take 2 tablets by mouth daily. Gummies    [provider]  guaiFENesin (MUCINEX) 600 MG 12 hr tablet Take 1,200 mg by mouth 2 (two) times daily.    [provider]  ibuprofen  (ADVIL ) 800 MG tablet Take 1 tablet (800 mg total) by mouth every 8 (eight) hours as needed. 11/20/22   Butler, Michael C, MD  levocetirizine (XYZAL) 5 MG tablet Take 5 mg by mouth daily.    [provider]  lidocaine  (LIDODERM ) 5 % Place 1 patch onto the skin daily. Remove & Discard patch within 12 hours or as directed by MD 09/12/21   Maralee Senate, April, MD  methocarbamol  (ROBAXIN ) 500 MG tablet Take 1 tablet (500 mg total) by mouth 2 (two) times daily as needed for muscle spasms. 11/20/22   Tonya Fredrickson, MD  Multiple Vitamins-Minerals (ADULT GUMMY PO) Take 2 tablets by mouth daily.    [provider]  naproxen  (NAPROSYN ) 500 MG tablet Take 1 tablet (500 mg total) by mouth 2  (two) times daily with a meal. 09/12/21   Palumbo, April, MD  pantoprazole  (PROTONIX ) 20 MG tablet Take 1 tablet (20 mg total) by mouth daily for 14 days. 02/15/22 03/01/22  Teddi Favors, DO  sucralfate  (CARAFATE ) 1 g tablet Take 1 tablet (1 g total) by mouth 4 (four) times daily -  with meals and at bedtime for 7 days. 02/15/22 02/22/22  Teddi Favors, DO      Allergies    Robitussin [guaifenesin] and Sudafed [pseudoephedrine hcl]    Review of Systems   Review of Systems  Constitutional:  Negative for chills and fever.  HENT:  Negative for ear pain and sore throat.   Eyes:  Negative for pain and visual disturbance.  Respiratory:  Negative for cough and shortness of breath.   Cardiovascular:  Negative for chest pain and palpitations.  Gastrointestinal:  Positive for abdominal pain and nausea. Negative for vomiting.  Genitourinary:  Negative for dysuria and hematuria.  Musculoskeletal:  Negative for arthralgias and back pain.  Skin:  Negative for color change and rash.  Neurological:  Negative for seizures and syncope.  All other systems reviewed and are negative.   Physical Exam Updated Vital Signs BP (!) 140/74   Pulse 85   Temp 98.3 F (36.8 C) (Oral)   Resp 17   Wt (!) 154.7 kg  SpO2 99%   BMI 62.37 kg/m  Physical Exam Vitals and nursing note reviewed.  Constitutional:      General: She is not in acute distress.    Appearance: Normal appearance. She is well-developed. She is obese. She is not ill-appearing.  HENT:     Head: Normocephalic and atraumatic.  Eyes:     Conjunctiva/sclera: Conjunctivae normal.  Cardiovascular:     Rate and Rhythm: Normal rate and regular rhythm.     Heart sounds: No murmur heard. Pulmonary:     Effort: Pulmonary effort is normal. No respiratory distress.     Breath sounds: Normal breath sounds.  Abdominal:     Palpations: Abdomen is soft.     Tenderness: There is abdominal tenderness. There is no guarding.  Musculoskeletal:         General: No swelling.     Cervical back: Neck supple.  Skin:    General: Skin is warm and dry.     Capillary Refill: Capillary refill takes less than 2 seconds.  Neurological:     General: No focal deficit present.     Mental Status: She is alert and oriented to person, place, and time.  Psychiatric:        Mood and Affect: Mood normal.     ED Results / Procedures / Treatments   Labs (all labs ordered are listed, but only abnormal results are displayed) Labs Reviewed  COMPREHENSIVE METABOLIC PANEL WITH GFR - Abnormal; Notable for the following components:      Result Value   Total Protein 8.5 (*)    All other components within normal limits  URINALYSIS, ROUTINE W REFLEX MICROSCOPIC - Abnormal; Notable for the following components:   Hgb urine dipstick TRACE (*)    All other components within normal limits  URINALYSIS, MICROSCOPIC (REFLEX) - Abnormal; Notable for the following components:   Bacteria, UA FEW (*)    All other components within normal limits  LIPASE, BLOOD  CBC  PREGNANCY, URINE    EKG None  Radiology No results found.  Procedures Procedures    Medications Ordered in ED Medications - No data to display  ED Course/ Medical Decision Making/ A&P                                 Medical Decision Making Amount and/or Complexity of Data Reviewed Labs: ordered. Radiology: ordered.  Risk Prescription drug management.   Patient is discomfort predominantly left upper quadrant.  Patient does have a history of hiatal hernia which could be playing a role.  There is some tenderness to palpation in the left upper quadrant but no guarding.  Patient's labs very reassuring lipase 41 complete metabolic panel LFTs normal renal function normal electrolytes normal.  Anion gap 14 white blood cell count normal at 9.9 hemoglobin 13.1 platelets 316 and urinalysis negative and pregnancy test negative.  Will order CT abdomen pelvis with contrast to further evaluate.   Disposition will be based on that.  Patient does have a primary care doctor to follow-up with.  CT of abdomen pelvis without any acute findings.  Will treat symptomatically.  Would recommend maybe Pepcid  for 2 weeks.   Final Clinical Impression(s) / ED Diagnoses Final diagnoses:  Left upper quadrant abdominal pain    Rx / DC Orders ED Discharge Orders     None         Nicklas Barns, MD 09/11/23 2326  Paulanthony Gleaves, MD 09/12/23 [phone]

## 2023-09-11 NOTE — ED Triage Notes (Signed)
 Pt arrives with c/o left sided ABD pain that started a few months ago. Per pt, the pain has worsened and become more consistent over the past couple of weeks. Pt endorses nausea.

## 2023-09-12 ENCOUNTER — Emergency Department (HOSPITAL_BASED_OUTPATIENT_CLINIC_OR_DEPARTMENT_OTHER)

## 2023-09-12 DIAGNOSIS — R109 Unspecified abdominal pain: Secondary | ICD-10-CM | POA: Diagnosis not present

## 2023-09-12 DIAGNOSIS — K5730 Diverticulosis of large intestine without perforation or abscess without bleeding: Secondary | ICD-10-CM | POA: Diagnosis not present

## 2023-09-12 DIAGNOSIS — K760 Fatty (change of) liver, not elsewhere classified: Secondary | ICD-10-CM | POA: Diagnosis not present

## 2023-09-12 MED ORDER — IOHEXOL 300 MG/ML  SOLN
125.0000 mL | Freq: Once | INTRAMUSCULAR | Status: AC | PRN
  Administered 2023-09-12: 125 mL via INTRAVENOUS

## 2023-09-12 NOTE — Discharge Instructions (Signed)
 Workup for the pain without any acute findings.  Recommended you follow back up with your primary care doctor.  Could start over-the-counter Pepcid  and that would be advisable to try that for 2 weeks.  Return for any new or worse symptoms.

## 2023-09-28 DIAGNOSIS — J40 Bronchitis, not specified as acute or chronic: Secondary | ICD-10-CM | POA: Diagnosis not present

## 2023-12-07 DIAGNOSIS — Z6841 Body Mass Index (BMI) 40.0 and over, adult: Secondary | ICD-10-CM | POA: Diagnosis not present

## 2023-12-07 DIAGNOSIS — M5431 Sciatica, right side: Secondary | ICD-10-CM | POA: Diagnosis not present

## 2023-12-12 ENCOUNTER — Emergency Department (HOSPITAL_COMMUNITY)

## 2023-12-12 ENCOUNTER — Encounter (HOSPITAL_BASED_OUTPATIENT_CLINIC_OR_DEPARTMENT_OTHER): Payer: Self-pay | Admitting: Emergency Medicine

## 2023-12-12 ENCOUNTER — Inpatient Hospital Stay (HOSPITAL_BASED_OUTPATIENT_CLINIC_OR_DEPARTMENT_OTHER)
Admission: EM | Admit: 2023-12-12 | Discharge: 2023-12-14 | DRG: 519 | Disposition: A | Discharge status: Home / Self Care

## 2023-12-12 ENCOUNTER — Other Ambulatory Visit: Payer: Self-pay

## 2023-12-12 DIAGNOSIS — F419 Anxiety disorder, unspecified: Secondary | ICD-10-CM | POA: Diagnosis not present

## 2023-12-12 DIAGNOSIS — M5441 Lumbago with sciatica, right side: Secondary | ICD-10-CM | POA: Diagnosis not present

## 2023-12-12 DIAGNOSIS — M48061 Spinal stenosis, lumbar region without neurogenic claudication: Secondary | ICD-10-CM | POA: Diagnosis not present

## 2023-12-12 DIAGNOSIS — M5127 Other intervertebral disc displacement, lumbosacral region: Secondary | ICD-10-CM | POA: Diagnosis not present

## 2023-12-12 DIAGNOSIS — Z6841 Body Mass Index (BMI) 40.0 and over, adult: Secondary | ICD-10-CM

## 2023-12-12 DIAGNOSIS — Z79899 Other long term (current) drug therapy: Secondary | ICD-10-CM | POA: Diagnosis not present

## 2023-12-12 DIAGNOSIS — G834 Cauda equina syndrome: Secondary | ICD-10-CM | POA: Diagnosis not present

## 2023-12-12 DIAGNOSIS — Z888 Allergy status to other drugs, medicaments and biological substances status: Secondary | ICD-10-CM

## 2023-12-12 DIAGNOSIS — M5117 Intervertebral disc disorders with radiculopathy, lumbosacral region: Secondary | ICD-10-CM | POA: Diagnosis not present

## 2023-12-12 DIAGNOSIS — M47816 Spondylosis without myelopathy or radiculopathy, lumbar region: Secondary | ICD-10-CM | POA: Diagnosis not present

## 2023-12-12 LAB — CBC WITH DIFFERENTIAL/PLATELET
Abs Granulocyte: 8.3 K/uL — ABNORMAL HIGH (ref 1.5–6.5)
Abs Immature Granulocytes: 0.08 K/uL — ABNORMAL HIGH (ref 0.00–0.07)
Basophils Absolute: 0.1 K/uL (ref 0.0–0.1)
Basophils Relative: 0 %
Eosinophils Absolute: 0.1 K/uL (ref 0.0–0.5)
Eosinophils Relative: 1 %
HCT: 41 % (ref 36.0–46.0)
Hemoglobin: 12.8 g/dL (ref 12.0–15.0)
Immature Granulocytes: 1 %
Lymphocytes Relative: 38 %
Lymphs Abs: 5.6 K/uL — ABNORMAL HIGH (ref 0.7–4.0)
MCH: 28 pg (ref 26.0–34.0)
MCHC: 31.2 g/dL (ref 30.0–36.0)
MCV: 89.7 fL (ref 80.0–100.0)
Monocytes Absolute: 0.8 K/uL (ref 0.1–1.0)
Monocytes Relative: 6 %
Neutro Abs: 8.3 K/uL — ABNORMAL HIGH (ref 1.7–7.7)
Neutrophils Relative %: 54 %
Platelets: 342 K/uL (ref 150–400)
RBC: 4.57 MIL/uL (ref 3.87–5.11)
RDW: 12.7 % (ref 11.5–15.5)
WBC: 15 K/uL — ABNORMAL HIGH (ref 4.0–10.5)
nRBC: 0 % (ref 0.0–0.2)

## 2023-12-12 LAB — BASIC METABOLIC PANEL WITH GFR
Anion gap: 12 (ref 5–15)
BUN: 17 mg/dL (ref 6–20)
CO2: 26 mmol/L (ref 22–32)
Calcium: 9.5 mg/dL (ref 8.9–10.3)
Chloride: 102 mmol/L (ref 98–111)
Creatinine, Ser: 0.8 mg/dL (ref 0.44–1.00)
GFR, Estimated: >60 mL/min (ref >60)
Glucose, Bld: 78 mg/dL (ref 70–99)
Potassium: 4.1 mmol/L (ref 3.5–5.1)
Sodium: 140 mmol/L (ref 135–145)

## 2023-12-12 MED ORDER — MORPHINE SULFATE (PF) 4 MG/ML IV SOLN
4.0000 mg | Freq: Once | INTRAVENOUS | Status: AC
  Administered 2023-12-12: 4 mg via INTRAVENOUS
  Filled 2023-12-12: qty 1

## 2023-12-12 MED ORDER — LIDOCAINE 5 % EX PTCH
1.0000 | MEDICATED_PATCH | CUTANEOUS | Status: DC
  Administered 2023-12-12: 1 via TRANSDERMAL
  Filled 2023-12-12: qty 1

## 2023-12-12 MED ORDER — KETOROLAC TROMETHAMINE 15 MG/ML IJ SOLN
15.0000 mg | Freq: Once | INTRAMUSCULAR | Status: AC
  Administered 2023-12-12: 15 mg via INTRAVENOUS
  Filled 2023-12-12: qty 1

## 2023-12-12 MED ORDER — GADOBUTROL 1 MMOL/ML IV SOLN
10.0000 mL | Freq: Once | INTRAVENOUS | Status: AC | PRN
  Administered 2023-12-12: 10 mL via INTRAVENOUS

## 2023-12-12 MED ORDER — DEXAMETHASONE SODIUM PHOSPHATE 10 MG/ML IJ SOLN
10.0000 mg | Freq: Once | INTRAMUSCULAR | Status: AC
  Administered 2023-12-12: 10 mg via INTRAVENOUS

## 2023-12-12 MED ORDER — LORAZEPAM 2 MG/ML IJ SOLN
1.0000 mg | Freq: Once | INTRAMUSCULAR | Status: AC
  Administered 2023-12-12: 1 mg via INTRAVENOUS
  Filled 2023-12-12: qty 1

## 2023-12-12 NOTE — ED Notes (Signed)
 Pt ambulated to the bathroom.

## 2023-12-12 NOTE — ED Provider Notes (Signed)
 [place] [facility] Provider Note   CSN: [id number] Arrival date & time: 12/12/23  1416     Patient presents with: Leg Pain   Courtney Vazquez is a 33 y.o. female with a past medical history significant for anxiety who presents to the ED due to right lower extremity pain and numbness/tingling x 1 week.  Patient states she was in the shower and felt a pulling sensation and then developed right lower extremity pain and numbness/tingling 1 week ago. Pain radiates from midline low back throughout posterior aspect of RLE.  Admits to some numbness/tingling to groin region.  She notes she is unable to feel when she wipes while using the bathroom.  No recent low back injury.  Has had low back issues in the past.  No fever or chills.  Denies IV drug use.  No history of cancer.  Denies any lower extremity weakness.  History obtained from patient and past medical records. No interpreter used during encounter.      Prior to Admission medications   Medication Sig Start Date End Date Taking? Authorizing Provider  Ascorbic Acid (VITAMIN C GUMMIE PO) Take 3 tablets by mouth daily.    [provider]  ELDERBERRY PO Take 2 tablets by mouth daily. Gummies    [provider]  guaiFENesin  (MUCINEX ) 600 MG 12 hr tablet Take 1,200 mg by mouth 2 (two) times daily.    [provider]  ibuprofen  (ADVIL ) 800 MG tablet Take 1 tablet (800 mg total) by mouth every 8 (eight) hours as needed. 11/20/22   Butler, Michael C, MD  levocetirizine (XYZAL) 5 MG tablet Take 5 mg by mouth daily.    [provider]  lidocaine  (LIDODERM ) 5 % Place 1 patch onto the skin daily. Remove & Discard patch within 12 hours or as directed by MD 09/12/21   Nettie, April, MD  methocarbamol  (ROBAXIN ) 500 MG tablet Take 1 tablet (500 mg total) by mouth 2 (two) times daily as needed for muscle spasms. 11/20/22   Towana Ozell BROCKS, MD  Multiple Vitamins-Minerals (ADULT GUMMY  PO) Take 2 tablets by mouth daily.    [provider]  naproxen  (NAPROSYN ) 500 MG tablet Take 1 tablet (500 mg total) by mouth 2 (two) times daily with a meal. 09/12/21   Palumbo, April, MD  pantoprazole  (PROTONIX ) 20 MG tablet Take 1 tablet (20 mg total) by mouth daily for 14 days. 02/15/22 03/01/22  Elnor Jayson LABOR, DO  sucralfate  (CARAFATE ) 1 g tablet Take 1 tablet (1 g total) by mouth 4 (four) times daily -  with meals and at bedtime for 7 days. 02/15/22 02/22/22  Elnor Jayson LABOR, DO    Allergies: Robitussin [guaifenesin ] and Sudafed [pseudoephedrine hcl]    Review of Systems  Constitutional:  Negative for fever.  Musculoskeletal:  Positive for back pain.    Updated Vital Signs BP (!) 141/122 (BP Location: Left Arm)   Pulse 83   Temp 99 F (37.2 C) (Oral)   Resp 20   Ht 5' 2 (1.575 m)   Wt (!) 158.8 kg   LMP 10/31/2023 (Exact Date)   SpO2 100%   BMI 64.02 kg/m   Physical Exam Vitals and nursing note reviewed.  Constitutional:      General: She is not in acute distress.    Appearance: She is not ill-appearing.  HENT:     Head: Normocephalic.  Eyes:     Pupils: Pupils are equal, round, and  reactive to light.  Cardiovascular:     Rate and Rhythm: Normal rate and regular rhythm.     Pulses: Normal pulses.     Heart sounds: Normal heart sounds. No murmur heard.    No friction rub. No gallop.  Pulmonary:     Effort: Pulmonary effort is normal.     Breath sounds: Normal breath sounds.  Abdominal:     General: Abdomen is flat. There is no distension.     Palpations: Abdomen is soft.     Tenderness: There is no abdominal tenderness. There is no guarding or rebound.  Genitourinary:    Comments: Rectal exam performed with chaperone in the room. Slightly decreased rectal tone?? Musculoskeletal:        General: Normal range of motion.     Cervical back: Neck supple.     Comments: No thoracic or lumbar midline tenderness. 5/5 strength of  bilateral lower extremities.  Sensation intact bilaterally.   Skin:    General: Skin is warm and dry.  Neurological:     General: No focal deficit present.     Mental Status: She is alert.  Psychiatric:        Mood and Affect: Mood normal.        Behavior: Behavior normal.     (all labs ordered are listed, but only abnormal results are displayed) Labs Reviewed  CBC WITH DIFFERENTIAL/PLATELET - Abnormal; Notable for the following components:      Result Value   WBC 15.0 (*)    Neutro Abs 8.3 (*)    Lymphs Abs 5.6 (*)    Abs Immature Granulocytes 0.08 (*)    Abs Granulocyte 8.3 (*)    All other components within normal limits  BASIC METABOLIC PANEL WITH GFR    EKG: None  Radiology: No results found.   Procedures   Medications Ordered in the ED  lidocaine  (LIDODERM ) 5 % 1 patch (1 patch Transdermal Patch Applied 12/12/23 1555)  morphine  (PF) 4 MG/ML injection 4 mg (4 mg Intravenous Given 12/12/23 1556)                                    Medical Decision Making Amount and/or Complexity of Data Reviewed Labs: ordered. Decision-making details documented in ED Course. Radiology: ordered and independent interpretation performed. Decision-making details documented in ED Course.  Risk Prescription drug management.   This patient presents to the ED for concern of RLE numbness/tingling, this involves an extensive number of treatment options, and is a complaint that carries with it a high risk of complications and morbidity.  The differential diagnosis includes cauda equina syndrome, lumbar radiculopathy, infection, muscular strain, etc  33 year old female presents to the ED due to right lower extremity pain and numbness/tingling x 1 week.  Also endorses some numbness/tingling to groin region.  No fever or chills.  Denies IV drug use.  No history of cancer.  Denies lower extremity weakness.  Upon arrival patient afebrile, not tachycardic or hypoxic.  Patient's BP 180/111 likely due to pain.  No headache or  visual changes.  Low suspicion for hypertensive emergency.  On exam patient has no thoracic or lumbar midline tenderness.  5/5 strength to bilateral lower extremities.  Possibly decreased rectal tone?.  Given some saddle anesthesia patient will require MRI lumbar spine to rule out cauda equina.  Routine labs ordered.  IV morphine  given.  CBC significant for leukocytosis at 15.  Normal hemoglobin.  BMP reassuring.  Normal renal function.  No major electrolyte derangements.  Dr. Yolande accepting for transfer ED to ED for MRI L spine to rule out cauda equina.   If MRI unremarkable, patient may be discharged home with pain management and follow-up.    Final diagnoses:  Acute right-sided low back pain with right-sided sciatica    ED Discharge Orders     None          Lorelle Aleck JAYSON DEVONNA 12/12/23 1744    Lenor Hollering, MD 12/12/23 [phone]

## 2023-12-12 NOTE — ED Notes (Signed)
 Pt transported to MRI

## 2023-12-12 NOTE — ED Triage Notes (Signed)
 Pt reports feeling a pull on the RLE when showering last Tuesday, continuing to have numbness and pain down her RLE, denies any known injury or trauma  went to PCP Thurs, prescribed gabapentin  and methyprednisolone; also tried naproxyn

## 2023-12-12 NOTE — ED Notes (Signed)
 Pt. Has good sensation in her legs.  Pt. Does have pain in her back low back when leg R and L are lifted.    Pt. Checked for Rectal tone.  Slightly  decreased per PAC

## 2023-12-12 NOTE — ED Triage Notes (Signed)
 Pt transferred [facility] for an MRI related to leg numbness and pain.  Some HTN otherwise VSS.

## 2023-12-12 NOTE — ED Notes (Signed)
 Adjusted patients legs with rolled blanket under knees.

## 2023-12-12 NOTE — ED Notes (Signed)
 Called [facility] for Consult to [facility] ED @15 :54.  Spoke with Debby

## 2023-12-12 NOTE — ED Notes (Signed)
 Pt. Reports on a week ago she was hurting on the R side of her back and down the R leg and R heel.  Pt. Then reports she felt relief and it started to hurt and pull on the L in the back of her L thigh.  And yesterday she got worse on the L side and a little on the R .  Pt. Said this morning she started to feel complete numbness in her vagina and butt crack.  She knows she has to pee and poop but can't feel when she wipes either the butt or the vagina.  Pt. Also said she has some numbness in her upper thigh some close to the groin area.

## 2023-12-12 NOTE — H&P (Signed)
 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  I explained that she is extremely high risk for perioperative complications including general anesthesia risks from lying prone, mortality, CSF leak, reherniation, nerve root injury, wound infection.  I explained that her severe obesity was the root cause of the current problem and of future complications.  However, given the presence of saddle anesthesia, I believe that she will require surgical intervention for the best long-term outcome. AAT DAT Tentative surgery date 8/21 Lovenox      CC: vagina numb  HPI:     Patient is a 33 y.o. female w/ hx superobesity BMI of 18 who presents with 1 week of symptoms concerning for cauda equina syndrome.  She has had severe bilateral lower extremity radicular pain.  She also has numbness in her vagina and with urination for 1 week.  She denies focal motor deficit.  She does complain of numbness in her bilateral dorsum of the feet  MRI lumbar spine shows large L5-S1 disc herniation   There are no active problems to display for this patient.  Past Medical History:  Diagnosis Date   Anxiety    Hiatal hernia     History reviewed. No pertinent surgical history.  (Not in a hospital admission)  Allergies  Allergen Reactions   Robitussin [Guaifenesin ] Nausea And Vomiting   Sudafed [Pseudoephedrine Hcl] Other (See Comments)    Hallucinations    Social History   Tobacco Use   Smoking status: Never   Smokeless tobacco: Not on file  Substance Use Topics   Alcohol use: Yes    Comment: occ    History reviewed. No pertinent family history.   Review of Systems Pertinent items are noted in HPI.  Objective:   Patient Vitals for the past 8 hrs:  BP Temp Temp src Pulse Resp SpO2  12/12/23 2216 139/87 98.9 F  (37.2 C) Oral 84 18 94 %  12/12/23 1828 (!) 163/92 98.9 F (37.2 C) Oral 81 16 100 %  12/12/23 1730 -- 99 F (37.2 C) Oral 83 -- 100 %  12/12/23 1715 (!) 146/80 -- -- 70 -- 100 %  12/12/23 1700 (!) 154/136 -- -- 80 -- 100 %  12/12/23 1645 (!) 149/84 -- -- 79 -- 100 %  12/12/23 1630 (!) 151/82 -- -- 72 -- 98 %  12/12/23 1615 (!) 141/97 -- -- 76 -- 100 %  12/12/23 1605 (!) 145/84 -- -- 76 -- 99 %   No intake/output data recorded. No intake/output data recorded.    Exam: RUE: 5/5 deltoid, 5/5 bicep, 5/5 wrist extension, 5/5 tricep, 5/5 grip LUE: 5/5 deltoid, 5/5 bicep, 5/5 wrist extension, 5/5 tricep, 5/5 grip RLE: 5/5 IP, 5/5 quad, 5/5 ham, 4/5 DF, 4/5 EHL, 4/5 PF LLE: 5/5 IP, 5/5 quad, 5/5 ham, 4/5 DF, 4/5 EHL, 4/5 PF Subjective numbness of the perianal region No Hoffman or Babinski     Data ReviewCBC:  Lab Results  Component Value Date   WBC 15.0 (H) 12/12/2023   RBC 4.57 12/12/2023   BMP:  Lab Results  Component Value Date   GLUCOSE 78 12/12/2023   CO2 26 12/12/2023   BUN 17 12/12/2023   CREATININE 0.80 12/12/2023   CALCIUM 9.5 12/12/2023

## 2023-12-13 ENCOUNTER — Inpatient Hospital Stay (HOSPITAL_COMMUNITY)

## 2023-12-13 ENCOUNTER — Encounter (HOSPITAL_COMMUNITY): Admission: EM | Disposition: A | Discharge status: Home / Self Care | Payer: Self-pay | Source: Home / Self Care

## 2023-12-13 ENCOUNTER — Encounter (HOSPITAL_COMMUNITY): Payer: Self-pay

## 2023-12-13 ENCOUNTER — Other Ambulatory Visit: Payer: Self-pay

## 2023-12-13 DIAGNOSIS — Z6841 Body Mass Index (BMI) 40.0 and over, adult: Secondary | ICD-10-CM | POA: Diagnosis not present

## 2023-12-13 DIAGNOSIS — M5127 Other intervertebral disc displacement, lumbosacral region: Secondary | ICD-10-CM | POA: Diagnosis present

## 2023-12-13 DIAGNOSIS — G834 Cauda equina syndrome: Secondary | ICD-10-CM | POA: Diagnosis present

## 2023-12-13 DIAGNOSIS — Z79899 Other long term (current) drug therapy: Secondary | ICD-10-CM | POA: Diagnosis not present

## 2023-12-13 DIAGNOSIS — Z888 Allergy status to other drugs, medicaments and biological substances status: Secondary | ICD-10-CM | POA: Diagnosis not present

## 2023-12-13 HISTORY — PX: LUMBAR LAMINECTOMY/DECOMPRESSION MICRODISCECTOMY: SHX5026

## 2023-12-13 LAB — ABO/RH: ABO/RH(D): B POS

## 2023-12-13 LAB — PREGNANCY, URINE: Preg Test, Ur: NEGATIVE

## 2023-12-13 LAB — TYPE AND SCREEN
ABO/RH(D): B POS
Antibody Screen: NEGATIVE

## 2023-12-13 LAB — SURGICAL PCR SCREEN
MRSA, PCR: NEGATIVE
Staphylococcus aureus: NEGATIVE

## 2023-12-13 LAB — PROTIME-INR
INR: 1 (ref 0.8–1.2)
Prothrombin Time: 13.7 s (ref 11.4–15.2)

## 2023-12-13 LAB — APTT: aPTT: 25 s (ref 24–36)

## 2023-12-13 SURGERY — LUMBAR LAMINECTOMY/DECOMPRESSION MICRODISCECTOMY 1 LEVEL
Anesthesia: General | Site: Spine Lumbar

## 2023-12-13 MED ORDER — MORPHINE SULFATE (PF) 2 MG/ML IV SOLN
1.0000 mg | INTRAVENOUS | Status: DC | PRN
  Administered 2023-12-14: 1 mg via INTRAVENOUS
  Filled 2023-12-13: qty 1

## 2023-12-13 MED ORDER — THROMBIN 5000 UNITS EX SOLR: OROMUCOSAL | Status: DC | PRN

## 2023-12-13 MED ORDER — ENOXAPARIN SODIUM 40 MG/0.4ML IJ SOSY: 40.0000 mg | PREFILLED_SYRINGE | INTRAMUSCULAR | Status: DC

## 2023-12-13 MED ORDER — CEFAZOLIN SODIUM-DEXTROSE 2-4 GM/100ML-% IV SOLN
2.0000 g | Freq: Four times a day (QID) | INTRAVENOUS | Status: AC
  Administered 2023-12-14 (×2): 2 g via INTRAVENOUS
  Filled 2023-12-13 (×2): qty 100

## 2023-12-13 MED ORDER — ROCURONIUM BROMIDE 10 MG/ML (PF) SYRINGE
PREFILLED_SYRINGE | INTRAVENOUS | Status: AC
  Filled 2023-12-13: qty 10

## 2023-12-13 MED ORDER — ONDANSETRON HCL 4 MG/2ML IJ SOLN: 4.0000 mg | Freq: Four times a day (QID) | INTRAMUSCULAR | Status: DC | PRN

## 2023-12-13 MED ORDER — CYCLOBENZAPRINE HCL 10 MG PO TABS
5.0000 mg | ORAL_TABLET | Freq: Three times a day (TID) | ORAL | Status: DC | PRN
Start: 2023-12-13 — End: 2023-12-13
  Administered 2023-12-13: 5 mg via ORAL
  Filled 2023-12-13: qty 1

## 2023-12-13 MED ORDER — ACETAMINOPHEN 325 MG PO TABS: 650.0000 mg | ORAL_TABLET | ORAL | Status: DC | PRN

## 2023-12-13 MED ORDER — CEFAZOLIN SODIUM-DEXTROSE 3-4 GM/150ML-% IV SOLN
3.0000 g | INTRAVENOUS | Status: AC
  Administered 2023-12-13: 3 g via INTRAVENOUS
  Filled 2023-12-13: qty 150

## 2023-12-13 MED ORDER — ONDANSETRON HCL 4 MG PO TABS: 4.0000 mg | ORAL_TABLET | Freq: Four times a day (QID) | ORAL | Status: DC | PRN

## 2023-12-13 MED ORDER — SODIUM CHLORIDE 0.9 % IV SOLN: INTRAVENOUS | Status: DC

## 2023-12-13 MED ORDER — SUGAMMADEX SODIUM 200 MG/2ML IV SOLN
INTRAVENOUS | Status: DC | PRN
  Administered 2023-12-13: 400 mg via INTRAVENOUS

## 2023-12-13 MED ORDER — HYDROMORPHONE HCL 1 MG/ML IJ SOLN
0.5000 mg | INTRAMUSCULAR | Status: DC | PRN
  Filled 2023-12-13: qty 0.5

## 2023-12-13 MED ORDER — ONDANSETRON HCL 4 MG/2ML IJ SOLN
4.0000 mg | Freq: Four times a day (QID) | INTRAMUSCULAR | Status: DC | PRN
  Administered 2023-12-13: 4 mg via INTRAVENOUS

## 2023-12-13 MED ORDER — OXYCODONE HCL 5 MG PO TABS: 5.0000 mg | ORAL_TABLET | Freq: Once | ORAL | Status: DC | PRN

## 2023-12-13 MED ORDER — MENTHOL 3 MG MT LOZG: 1.0000 | LOZENGE | OROMUCOSAL | Status: DC | PRN

## 2023-12-13 MED ORDER — MIDAZOLAM HCL 2 MG/2ML IJ SOLN
INTRAMUSCULAR | Status: DC | PRN
  Administered 2023-12-13: 2 mg via INTRAVENOUS

## 2023-12-13 MED ORDER — SODIUM CHLORIDE 0.9 % IV SOLN: 250.0000 mL | INTRAVENOUS | Status: DC

## 2023-12-13 MED ORDER — POLYETHYLENE GLYCOL 3350 17 G PO PACK: 17.0000 g | PACK | Freq: Every day | ORAL | Status: DC | PRN

## 2023-12-13 MED ORDER — FENTANYL CITRATE (PF) 250 MCG/5ML IJ SOLN
INTRAMUSCULAR | Status: DC | PRN
  Administered 2023-12-13 (×3): 50 ug via INTRAVENOUS
  Administered 2023-12-13: 100 ug via INTRAVENOUS

## 2023-12-13 MED ORDER — SODIUM CHLORIDE 0.9% FLUSH: 3.0000 mL | Freq: Two times a day (BID) | INTRAVENOUS | Status: DC

## 2023-12-13 MED ORDER — ONDANSETRON HCL 4 MG/2ML IJ SOLN: 4.0000 mg | Freq: Once | INTRAMUSCULAR | Status: DC | PRN

## 2023-12-13 MED ORDER — OXYCODONE HCL 5 MG PO TABS
5.0000 mg | ORAL_TABLET | ORAL | Status: DC | PRN
  Administered 2023-12-13: 5 mg via ORAL
  Filled 2023-12-13: qty 1

## 2023-12-13 MED ORDER — ONDANSETRON HCL 4 MG/2ML IJ SOLN
INTRAMUSCULAR | Status: AC
  Filled 2023-12-13: qty 2

## 2023-12-13 MED ORDER — CHLORHEXIDINE GLUCONATE 0.12 % MT SOLN
OROMUCOSAL | Status: AC
  Administered 2023-12-13: 15 mL via OROMUCOSAL
  Filled 2023-12-13: qty 15

## 2023-12-13 MED ORDER — ACETAMINOPHEN 650 MG RE SUPP
650.0000 mg | RECTAL | Status: DC | PRN
Start: 2023-12-13 — End: 2023-12-14

## 2023-12-13 MED ORDER — GABAPENTIN 300 MG PO CAPS
300.0000 mg | ORAL_CAPSULE | Freq: Every day | ORAL | Status: DC
Start: 2023-12-13 — End: 2023-12-14
  Administered 2023-12-13: 300 mg via ORAL
  Filled 2023-12-13: qty 1

## 2023-12-13 MED ORDER — ROCURONIUM BROMIDE 10 MG/ML (PF) SYRINGE
PREFILLED_SYRINGE | INTRAVENOUS | Status: DC | PRN
  Administered 2023-12-13: 20 mg via INTRAVENOUS
  Administered 2023-12-13: 60 mg via INTRAVENOUS
  Administered 2023-12-13: 40 mg via INTRAVENOUS

## 2023-12-13 MED ORDER — SODIUM CHLORIDE 0.9% FLUSH: 3.0000 mL | INTRAVENOUS | Status: DC | PRN

## 2023-12-13 MED ORDER — PROPOFOL 10 MG/ML IV BOLUS
INTRAVENOUS | Status: AC
  Filled 2023-12-13: qty 20

## 2023-12-13 MED ORDER — SODIUM CHLORIDE 0.9% FLUSH
3.0000 mL | Freq: Two times a day (BID) | INTRAVENOUS | Status: DC
  Administered 2023-12-13 – 2023-12-14 (×2): 3 mL via INTRAVENOUS

## 2023-12-13 MED ORDER — LIDOCAINE 2% (20 MG/ML) 5 ML SYRINGE
INTRAMUSCULAR | Status: AC
  Filled 2023-12-13: qty 5

## 2023-12-13 MED ORDER — PHENOL 1.4 % MT LIQD: 1.0000 | OROMUCOSAL | Status: DC | PRN

## 2023-12-13 MED ORDER — CHLORHEXIDINE GLUCONATE 0.12 % MT SOLN
15.0000 mL | Freq: Once | OROMUCOSAL | Status: AC
  Filled 2023-12-13: qty 15

## 2023-12-13 MED ORDER — HYDROMORPHONE HCL 1 MG/ML IJ SOLN
INTRAMUSCULAR | Status: AC
  Filled 2023-12-13: qty 1

## 2023-12-13 MED ORDER — DEXAMETHASONE SODIUM PHOSPHATE 10 MG/ML IJ SOLN
INTRAMUSCULAR | Status: AC
  Filled 2023-12-13: qty 1

## 2023-12-13 MED ORDER — PHENYLEPHRINE 80 MCG/ML (10ML) SYRINGE FOR IV PUSH (FOR BLOOD PRESSURE SUPPORT)
PREFILLED_SYRINGE | INTRAVENOUS | Status: DC | PRN
  Administered 2023-12-13: 80 ug via INTRAVENOUS

## 2023-12-13 MED ORDER — KETOROLAC TROMETHAMINE 30 MG/ML IJ SOLN
INTRAMUSCULAR | Status: AC
Start: 2023-12-13 — End: 2023-12-13
  Filled 2023-12-13: qty 1

## 2023-12-13 MED ORDER — GUAIFENESIN ER 600 MG PO TB12
1200.0000 mg | ORAL_TABLET | Freq: Two times a day (BID) | ORAL | Status: DC
  Filled 2023-12-13: qty 2

## 2023-12-13 MED ORDER — PHENYLEPHRINE 80 MCG/ML (10ML) SYRINGE FOR IV PUSH (FOR BLOOD PRESSURE SUPPORT)
PREFILLED_SYRINGE | INTRAVENOUS | Status: AC
  Filled 2023-12-13: qty 10

## 2023-12-13 MED ORDER — MEPERIDINE HCL 25 MG/ML IJ SOLN: 6.2500 mg | INTRAMUSCULAR | Status: DC | PRN

## 2023-12-13 MED ORDER — THROMBIN 5000 UNITS EX KIT
PACK | CUTANEOUS | Status: AC
Start: 2023-12-13 — End: 2023-12-13
  Filled 2023-12-13: qty 1

## 2023-12-13 MED ORDER — FENTANYL CITRATE (PF) 250 MCG/5ML IJ SOLN
INTRAMUSCULAR | Status: AC
  Filled 2023-12-13: qty 5

## 2023-12-13 MED ORDER — KETOROLAC TROMETHAMINE 30 MG/ML IJ SOLN
30.0000 mg | Freq: Once | INTRAMUSCULAR | Status: AC | PRN
  Administered 2023-12-13: 30 mg via INTRAVENOUS

## 2023-12-13 MED ORDER — 0.9 % SODIUM CHLORIDE (POUR BTL) OPTIME
TOPICAL | Status: DC | PRN
  Administered 2023-12-13: 1000 mL

## 2023-12-13 MED ORDER — DEXAMETHASONE SODIUM PHOSPHATE 10 MG/ML IJ SOLN
INTRAMUSCULAR | Status: DC | PRN
  Administered 2023-12-13: 10 mg via INTRAVENOUS

## 2023-12-13 MED ORDER — LIDOCAINE 2% (20 MG/ML) 5 ML SYRINGE
INTRAMUSCULAR | Status: DC | PRN
  Administered 2023-12-13: 60 mg via INTRAVENOUS

## 2023-12-13 MED ORDER — PANTOPRAZOLE SODIUM 20 MG PO TBEC
20.0000 mg | DELAYED_RELEASE_TABLET | Freq: Every day | ORAL | Status: DC
  Administered 2023-12-13: 20 mg via ORAL
  Filled 2023-12-13: qty 1

## 2023-12-13 MED ORDER — SODIUM CHLORIDE 0.9 % IV SOLN
INTRAVENOUS | Status: DC
Start: 2023-12-13 — End: 2023-12-13

## 2023-12-13 MED ORDER — KETAMINE HCL 50 MG/5ML IJ SOSY
PREFILLED_SYRINGE | INTRAMUSCULAR | Status: AC
Start: 2023-12-13 — End: 2023-12-13
  Filled 2023-12-13: qty 5

## 2023-12-13 MED ORDER — ACETAMINOPHEN 650 MG RE SUPP: 650.0000 mg | RECTAL | Status: DC | PRN

## 2023-12-13 MED ORDER — KETAMINE HCL 10 MG/ML IJ SOLN
INTRAMUSCULAR | Status: DC | PRN
  Administered 2023-12-13: 10 mg via INTRAVENOUS
  Administered 2023-12-13: 20 mg via INTRAVENOUS

## 2023-12-13 MED ORDER — ACETAMINOPHEN 325 MG PO TABS
650.0000 mg | ORAL_TABLET | ORAL | Status: DC | PRN
  Filled 2023-12-13: qty 2

## 2023-12-13 MED ORDER — ACETAMINOPHEN 500 MG PO TABS
1000.0000 mg | ORAL_TABLET | Freq: Once | ORAL | Status: AC
  Administered 2023-12-13: 1000 mg via ORAL
  Filled 2023-12-13: qty 2

## 2023-12-13 MED ORDER — PROPOFOL 10 MG/ML IV BOLUS
INTRAVENOUS | Status: DC | PRN
  Administered 2023-12-13: 200 mg via INTRAVENOUS

## 2023-12-13 MED ORDER — CYCLOBENZAPRINE HCL 10 MG PO TABS: 5.0000 mg | ORAL_TABLET | Freq: Three times a day (TID) | ORAL | Status: DC | PRN

## 2023-12-13 MED ORDER — SUCCINYLCHOLINE CHLORIDE 200 MG/10ML IV SOSY
PREFILLED_SYRINGE | INTRAVENOUS | Status: DC | PRN
  Administered 2023-12-13: 140 mg via INTRAVENOUS

## 2023-12-13 MED ORDER — HYDROMORPHONE HCL 1 MG/ML IJ SOLN
INTRAMUSCULAR | Status: AC
  Filled 2023-12-13: qty 0.5

## 2023-12-13 MED ORDER — OXYCODONE HCL 5 MG PO TABS
5.0000 mg | ORAL_TABLET | ORAL | Status: DC | PRN
  Administered 2023-12-14: 5 mg via ORAL
  Filled 2023-12-13: qty 1

## 2023-12-13 MED ORDER — BUPIVACAINE-EPINEPHRINE 0.5% -1:200000 IJ SOLN
INTRAMUSCULAR | Status: DC | PRN
  Administered 2023-12-13: 10 mL

## 2023-12-13 MED ORDER — PHENYLEPHRINE HCL-NACL 20-0.9 MG/250ML-% IV SOLN
INTRAVENOUS | Status: DC | PRN
  Administered 2023-12-13: 10 ug/min via INTRAVENOUS

## 2023-12-13 MED ORDER — OXYCODONE HCL 5 MG/5ML PO SOLN: 5.0000 mg | Freq: Once | ORAL | Status: DC | PRN

## 2023-12-13 MED ORDER — HYDROMORPHONE HCL 1 MG/ML IJ SOLN
0.2500 mg | INTRAMUSCULAR | Status: DC | PRN
  Administered 2023-12-13: 0.5 mg via INTRAVENOUS

## 2023-12-13 MED ORDER — AMISULPRIDE (ANTIEMETIC) 5 MG/2ML IV SOLN: 10.0000 mg | Freq: Once | INTRAVENOUS | Status: DC | PRN

## 2023-12-13 MED ORDER — LACTATED RINGERS IV SOLN: INTRAVENOUS | Status: DC

## 2023-12-13 MED ORDER — DOCUSATE SODIUM 100 MG PO CAPS
100.0000 mg | ORAL_CAPSULE | Freq: Two times a day (BID) | ORAL | Status: DC
  Administered 2023-12-13 – 2023-12-14 (×2): 100 mg via ORAL
  Filled 2023-12-13 (×2): qty 1

## 2023-12-13 MED ORDER — BUPIVACAINE-EPINEPHRINE (PF) 0.5% -1:200000 IJ SOLN
INTRAMUSCULAR | Status: AC
  Filled 2023-12-13: qty 30

## 2023-12-13 MED ORDER — ORAL CARE MOUTH RINSE: 15.0000 mL | Freq: Once | OROMUCOSAL | Status: AC

## 2023-12-13 MED ORDER — MIDAZOLAM HCL 2 MG/2ML IJ SOLN
INTRAMUSCULAR | Status: AC
  Filled 2023-12-13: qty 2

## 2023-12-13 MED ORDER — OXYCODONE HCL 5 MG PO TABS: 5.0000 mg | ORAL_TABLET | ORAL | Status: DC | PRN

## 2023-12-13 SURGICAL SUPPLY — 39 items
BAG COUNTER SPONGE SURGICOUNT (BAG) ×1 IMPLANT
BAND RUBBER #18 3X1/16 STRL (MISCELLANEOUS) ×2 IMPLANT
BLADE CLIPPER SURG (BLADE) IMPLANT
BUR MATCHSTICK NEURO 3.0 LAGG (BURR) ×1 IMPLANT
CANISTER SUCTION 3000ML PPV (SUCTIONS) ×1 IMPLANT
DERMABOND ADVANCED .7 DNX12 (GAUZE/BANDAGES/DRESSINGS) ×1 IMPLANT
DRAPE HALF SHEET 40X57 (DRAPES) IMPLANT
DRAPE LAPAROTOMY T 102X78X121 (DRAPES) ×1 IMPLANT
DRAPE MICROSCOPE SLANT 54X150 (MISCELLANEOUS) IMPLANT
DURAPREP 26ML APPLICATOR (WOUND CARE) ×1 IMPLANT
ELECTRODE REM PT RTRN 9FT ADLT (ELECTROSURGICAL) ×1 IMPLANT
GAUZE 4X4 16PLY ~~LOC~~+RFID DBL (SPONGE) IMPLANT
GAUZE PAD ABD 8X10 STRL (GAUZE/BANDAGES/DRESSINGS) IMPLANT
GAUZE SPONGE 4X4 12PLY STRL (GAUZE/BANDAGES/DRESSINGS) ×1 IMPLANT
GLOVE BIOGEL PI IND STRL 8.5 (GLOVE) ×1 IMPLANT
GLOVE ECLIPSE 8.5 STRL (GLOVE) ×1 IMPLANT
GOWN STRL REUS W/ TWL LRG LVL3 (GOWN DISPOSABLE) IMPLANT
GOWN STRL REUS W/ TWL XL LVL3 (GOWN DISPOSABLE) IMPLANT
GOWN STRL REUS W/TWL 2XL LVL3 (GOWN DISPOSABLE) ×1 IMPLANT
HEMOSTAT POWDER KIT SURGIFOAM (HEMOSTASIS) ×1 IMPLANT
KIT BASIN OR (CUSTOM PROCEDURE TRAY) ×1 IMPLANT
KIT TURNOVER KIT B (KITS) ×1 IMPLANT
NDL HYPO 22X1.5 SAFETY MO (MISCELLANEOUS) ×1 IMPLANT
NDL SPNL 20GX3.5 QUINCKE YW (NEEDLE) IMPLANT
NEEDLE HYPO 22X1.5 SAFETY MO (MISCELLANEOUS) ×1 IMPLANT
NEEDLE SPNL 20GX3.5 QUINCKE YW (NEEDLE) IMPLANT
NS IRRIG 1000ML POUR BTL (IV SOLUTION) ×1 IMPLANT
PACK LAMINECTOMY NEURO (CUSTOM PROCEDURE TRAY) ×1 IMPLANT
PAD ARMBOARD POSITIONER FOAM (MISCELLANEOUS) ×3 IMPLANT
PATTIES SURGICAL .5 X1 (DISPOSABLE) ×1 IMPLANT
SPIKE FLUID TRANSFER (MISCELLANEOUS) ×1 IMPLANT
SPONGE SURGIFOAM ABS GEL SZ50 (HEMOSTASIS) IMPLANT
SUT VIC AB 1 CT1 18XBRD ANBCTR (SUTURE) ×1 IMPLANT
SUT VIC AB 2-0 CP2 18 (SUTURE) ×1 IMPLANT
SUT VIC AB 3-0 SH 8-18 (SUTURE) ×1 IMPLANT
TAPE CLOTH SURG 4X10 WHT LF (GAUZE/BANDAGES/DRESSINGS) IMPLANT
TOWEL GREEN STERILE (TOWEL DISPOSABLE) ×1 IMPLANT
TOWEL GREEN STERILE FF (TOWEL DISPOSABLE) ×1 IMPLANT
WATER STERILE IRR 1000ML POUR (IV SOLUTION) ×1 IMPLANT

## 2023-12-13 NOTE — Op Note (Signed)
 PREOP DIAGNOSIS: Herniated nucleus pulposus at L5-S1 with radiculopathy  POSTOP DIAGNOSIS: Herniated nucleus pulposus at L5-S1 with radiculopathy  PROCEDURE: 1. Right L5-S1 laminotomy, medial facetectomy for excision of herniated disc 2.  Use of microscope for microdissection  SURGEON: Dr. Dorn Glade, MD  ASSISTANT: Arley Helling, MD Please note, there were no qualified trainees available to assist with the procedure.  An assistant was required for aid in retraction of the neural elements.   ANESTHESIA: General Endotracheal  EBL: 50cc  SPECIMENS: None  DRAINS: None  COMPLICATIONS: none  CONDITION: Stable to PCAU  HISTORY: Courtney Vazquez is a 33 y.o. 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 Risks, benefits, alternatives, and expected convalescence were discussed.  Risks discussed included, but were not limited to, bleeding, pain, infection, scar, recurrent disc, instability, CSF leak, weakness, numbness, paralysis, general anesthesia risk, and death. Given her weight, I placed special emphasis on extremely high risk of complication in the peri-op period given her weight, but felt that surgery was best for her long term outcome. The patient verbalized understanding and wished to proceed.  PROCEDURE IN DETAIL: The patient was brought to the operating room. After induction of general anesthesia, the patient was positioned on the operative table in the prone position on a Wilson frame with all pressure points meticulously padded. The skin of the low back was then prepped and draped in the usual sterile fashion. Preoperative antibiotics were administered  Under fluoroscopy, the correct levels were identified and marked out on the skin, and after timeout was conducted, the skin was infiltrated with local anesthetic. I made a sharp midline incision  over the affected levels. Electrocautery was used to perform a subperiosteal dissection of the L5-S1 laminae and L5-S1 facet.  A self-retaining retractor was placed.  Fluoroscopy was used to identify the correct level.  Microscope was then brought into the field. High-speed drill was used to perform a laminotomy and modest medial facetectomy.  The ligamentum flavum was then removed with rongeurs.  The thecal sac and traversing nerve root were identified.  The epidural disc space was dissected with a 4 Penfield and bipolar electrocautery.  Upon retracting the nerve root medially, the large disc herniation was encountered.  The disc herniation was incised and combination of nerve hook, penfield, and pituitary rongeur was used to remove multiple large disc fragments.  Smaller additional fragments were also removed.  We then performed a thorough inspection of the traversing nerve root axilla and removed another small fragment. Following removal of the herniated disc, the nerve root appeared much more relaxed.  Woodson probe was used to ensure good decompression and there was no longer significant's impingement of the nerve roots.  Meticulous hemostasis was obtained.  The wound was irrigated thoroughly with bacitracin impregnated irrigation.   Retractors were removed.  The fascia was closed with 1 Vicryl stitches.  The dermal layer was closed with 2-0 Vicryl stitches in buried interrupted fashion.  The skin was closed with 3-0 Vicryl and running 4-0 monocryl. We then sutured an additional skin layer of running 3-0 prolene.  A sterile dressing was placed.  Patient was then flipped supine and extubated by the anesthesia service.  All counts were correct at the end of surgery.  No complications were noted.

## 2023-12-13 NOTE — Anesthesia Postprocedure Evaluation (Signed)
 Anesthesia Post Note  Patient: Courtney Vazquez  Procedure(s) Performed: LUMBAR FIVE-SACRAL ONE MICRODISCECTOMY (Spine Lumbar)     Patient location during evaluation: PACU Anesthesia Type: General Level of consciousness: awake and alert Pain management: pain level controlled Vital Signs Assessment: post-procedure vital signs reviewed and stable Respiratory status: spontaneous breathing, nonlabored ventilation, respiratory function stable and patient connected to nasal cannula oxygen Cardiovascular status: blood pressure returned to baseline and stable Postop Assessment: no apparent nausea or vomiting Anesthetic complications: no   No notable events documented.  Last Vitals:  Vitals:   12/13/23 2000 12/13/23 2020  BP: (!) 153/89 (!) 149/86  Pulse: 79 75  Resp: (!) 22 20  Temp: 36.6 C 36.6 C  SpO2: 95% 99%    Last Pain:  Vitals:   12/13/23 2020  TempSrc: Oral  PainSc: 2                  Cordella SQUIBB Yui Mulvaney

## 2023-12-13 NOTE — Anesthesia Preprocedure Evaluation (Addendum)
 Anesthesia Evaluation  Patient identified by MRN, date of birth, ID band Patient awake    Reviewed: Allergy & Precautions, H&P , NPO status , Patient's Chart, lab work & pertinent test results  Airway Mallampati: II  TM Distance: >3 FB Neck ROM: Full    Dental  (+) Dental Advisory Given   Pulmonary neg pulmonary ROS   Pulmonary exam normal breath sounds clear to auscultation       Cardiovascular negative cardio ROS Normal cardiovascular exam Rhythm:Regular Rate:Normal     Neuro/Psych  PSYCHIATRIC DISORDERS Anxiety      Neuromuscular disease  negative psych ROS   GI/Hepatic negative GI ROS, Neg liver ROS, hiatal hernia,,,  Endo/Other  negative endocrine ROS  Class 4 obesity (BMI 63)  Renal/GU negative Renal ROS  negative genitourinary   Musculoskeletal negative musculoskeletal ROS (+)  cauda equina syndrome 2/2 L5-S1 large disc herniation   Abdominal  (+) + obese  Peds negative pediatric ROS (+)  Hematology negative hematology ROS (+)   Anesthesia Other Findings   Reproductive/Obstetrics negative OB ROS Urine preg neg                              Anesthesia Physical Anesthesia Plan  ASA: 4  Anesthesia Plan: General   Post-op Pain Management: Tylenol  PO (pre-op)*, Toradol  IV (intra-op)*, Precedex, Ketamine  IV* and Dilaudid  IV   Induction: Intravenous  PONV Risk Score and Plan: 3 and Ondansetron , Dexamethasone , Midazolam  and Treatment may vary due to age or medical condition  Airway Management Planned: Oral ETT  Additional Equipment: None  Intra-op Plan:   Post-operative Plan: Extubation in OR  Informed Consent: I have reviewed the patients History and Physical, chart, labs and discussed the procedure including the risks, benefits and alternatives for the proposed anesthesia with the patient or authorized representative who has indicated his/her understanding and acceptance.      Dental advisory given  Plan Discussed with: CRNA  Anesthesia Plan Comments:          Anesthesia Quick Evaluation

## 2023-12-13 NOTE — Progress Notes (Signed)
 Patient arrived to unit 3 west bed 23 from emergency department. Assisted patient to bed by nursing staff. Patient alert and oriented x 4. Oriented patient to nursing unit and call bell system. Patient denies pain but complains of numbness right thigh, bottom of right foot, and vagina. Educated patient to ask for help prior to getting out of bed for safety.Patient verbalized understanding. Patient call bell and personal belongings within reach yellow bracelet placed on patient and bed alarm set for safety.

## 2023-12-13 NOTE — Progress Notes (Signed)
 Assessment 33 y/o F w/ hx super obesity who presented with CES d/t L5-S1 large herniated disc. Underwent right L5-S1 microdiscectomy (8/20)  LOS: 0 days    Plan: AAT DAT Hopeful discharge tomorrow   Subjective: Pt awoke without issue. She denied leg pain  Objective: Vital signs in last 24 hours: Temp:  [97.8 F (36.6 C)-98.9 F (37.2 C)] 98.2 F (36.8 C) (08/20 1528) Pulse Rate:  [76-85] 78 (08/20 1528) Resp:  [17-19] 19 (08/20 1528) BP: (137-149)/(79-96) 149/96 (08/20 1528) SpO2:  [94 %-99 %] 99 % (08/20 1528) Weight:  [156.9 kg-157 kg] 156.9 kg (08/20 1528)  Intake/Output from previous day: No intake/output data recorded. Intake/Output this shift: No intake/output data recorded.  Exam: 5/5 b/l hand grip RLE: 5/5 IP, 5/5 quad, 5/5 ham, 5/5 DF, 5/5 EHL, 5/5 PF LLE: 5/5 IP, 5/5 quad, 5/5 ham, 5/5 DF, 5/5 EHL, 5/5 PF  Lab Results: Recent Labs    12/12/23 1555  WBC 15.0*  HGB 12.8  HCT 41.0  PLT 342   BMET Recent Labs    12/12/23 1555  NA 140  K 4.1  CL 102  CO2 26  GLUCOSE 78  BUN 17  CREATININE 0.80  CALCIUM 9.5    Studies/Results: DG C-Arm 1-60 Min-No Report Result Date: 12/13/2023 Fluoroscopy was utilized by the requesting physician.  No radiographic interpretation.   DG C-Arm 1-60 Min-No Report Result Date: 12/13/2023 Fluoroscopy was utilized by the requesting physician.  No radiographic interpretation.   MR Lumbar Spine W Wo Contrast Result Date: 12/12/2023 CLINICAL DATA:  Low back pain, cauda equina syndrome suspected EXAM: MRI LUMBAR SPINE WITHOUT AND WITH CONTRAST TECHNIQUE: Multiplanar and multiecho pulse sequences of the lumbar spine were obtained without and with intravenous contrast. CONTRAST:  10mL GADAVIST  GADOBUTROL  1 MMOL/ML IV SOLN COMPARISON:  None Available. FINDINGS: Segmentation: Standard. Alignment: Normal. Vertebrae: Degenerative/discogenic endplate signal changes at L5-S1. No evidence of acute fracture or suspicious bone lesion.  Conus medullaris and cauda equina: Conus extends to the L1-L2 level. Conus appears normal. Paraspinal and other soft tissues: Unremarkable. Disc levels: No significant canal or foraminal stenosis at T12-L1 through L4-L5. Slight disc bulge and mild facet arthropathy at L4-L5. At L5-S1, disc height loss with large right paracentral disc protrusion which result in severe canal stenosis and impingement of the descending cauda equina nerve roots. Severe right greater than left subarticular recess stenosis. IMPRESSION: At L5-S1, large right paracentral disc protrusion which results in severe canal stenosis and impingement of the descending right greater than left cauda equina nerve roots. Electronically Signed   By: Gilmore GORMAN Molt M.D.   On: 12/12/2023 21:24      Courtney Vazquez 12/13/2023, 7:06 PM

## 2023-12-13 NOTE — TOC Initial Note (Signed)
 Transition of Care [facility]) - Initial/Assessment Note    Patient Details  Name: Courtney Vazquez MRN: [id number] Date of Birth: 1990/05/29  Transition of Care [facility]) CM/SW Contact:    Andrez JULIANNA George, RN Phone Number: 12/13/2023, 3:27 PM  Clinical Narrative:                  Pt is from home alone. She plans to go to her grandmothers after discharge for support. She also has friends and cousins that will assist.  Pt states her friends will assist with transportation.  She doesn't take any prescription medications at home.  Current recommendations are for outpatient therapy. Pt having surgery today and will need therapies to re-eval after.  IP Care management following.  Expected Discharge Plan:  (TBD) Barriers to Discharge: Continued Medical Work up   Patient Goals and CMS Choice            Expected Discharge Plan and Services       Living arrangements for the past 2 months: Single Family Home                                      Prior Living Arrangements/Services Living arrangements for the past 2 months: Single Family Home Lives with:: Self Patient language and need for interpreter reviewed:: Yes Do you feel safe going back to the place where you live?: Yes          Current home services: DME (walker) Criminal Activity/Legal Involvement Pertinent to Current Situation/Hospitalization: No - Comment as needed  Activities of Daily Living   ADL Screening (condition at time of admission) Independently performs ADLs?: Yes (appropriate for developmental age) Is the patient deaf or have difficulty hearing?: No Does the patient have difficulty seeing, even when wearing glasses/contacts?: No Does the patient have difficulty concentrating, remembering, or making decisions?: No  Permission Sought/Granted                  Emotional Assessment Appearance:: Appears stated age Attitude/Demeanor/Rapport: Engaged Affect (typically observed):  Accepting Orientation: : Oriented to Self, Oriented to Place, Oriented to  Time, Oriented to Situation   Psych Involvement: No (comment)  Admission diagnosis:  Cauda equina compression (HCC) [G83.4] Acute right-sided low back pain with right-sided sciatica [M54.41] Patient Active Problem List   Diagnosis Date Noted   Cauda equina compression (HCC) 12/13/2023   PCP:  Ina Marcellus RAMAN, MD Pharmacy:   [facility] [address] [place] - [address] [place] [address] Phone: [phone] Fax: [phone]     Social Drivers of Health (SDOH) Social History: SDOH Screenings   Food Insecurity: No Food Insecurity (12/13/2023)  Housing: Low Risk  (12/13/2023)  Transportation Needs: No Transportation Needs (12/13/2023)  Utilities: Not At Risk (12/13/2023)  Tobacco Use: Unknown (12/12/2023)   SDOH Interventions:     Readmission Risk Interventions     No data to display

## 2023-12-13 NOTE — Evaluation (Signed)
 Physical Therapy Evaluation Patient Details Name: Courtney Vazquez MRN: [id number] DOB: [birth year] Today's Date: 12/13/2023  History of Present Illness  33 y.o. female presents to [facility] 12/12/23 with cauda equina syndrome 2/2 L5-S1 large disc herniation. Tentative microdiscectomy on 8/21. PMHx: hiatal hernia, obesity, anxiety  Clinical Impression  PTA, pt was independent for mobility with no AD. In today's session, pt was able to stand and ambulate 347ft with no AD and supervision for safety. Educated pt on back precautions for upcoming surgery with MinA required for bed mobility. Pt will has 24/7 supervision available upon return home. Recommending OP PT to address functional impairments. Will continue to follow acutely.       If plan is discharge home, recommend the following: A little help with walking and/or transfers;Assist for transportation;Help with stairs or ramp for entrance;A little help with bathing/dressing/bathroom   Can travel by private vehicle    Yes    Equipment Recommendations Other (comment) (TBD)     Functional Status Assessment Patient has had a recent decline in their functional status and demonstrates the ability to make significant improvements in function in a reasonable and predictable amount of time.     Precautions / Restrictions Precautions Precautions: Fall;Back Precaution Booklet Issued: Yes (comment) Recall of Precautions/Restrictions: Intact Precaution/Restrictions Comments: initated education on back precautions and provided handout for upcoming surgery Restrictions Weight Bearing Restrictions Per Provider Order: No      Mobility  Bed Mobility Overal bed mobility: Needs Assistance Bed Mobility: Rolling, Sidelying to Sit, Sit to Sidelying Rolling: Contact guard assist Sidelying to sit: Contact guard assist    Sit to sidelying: Min assist General bed mobility comments: MinA for slight LE management    Transfers Overall transfer level:  Needs assistance Equipment used: None Transfers: Sit to/from Stand Sit to Stand: Supervision   Ambulation/Gait Ambulation/Gait assistance: Supervision Gait Distance (Feet): 300 Feet Assistive device: None Gait Pattern/deviations: Step-through pattern, Decreased stride length Gait velocity: decr     General Gait Details: steady gait with no overt LOB    Balance Overall balance assessment: No apparent balance deficits (not formally assessed)        Pertinent Vitals/Pain Pain Assessment Pain Assessment: Faces Faces Pain Scale: Hurts even more Pain Location: sacrum and low back Pain Descriptors / Indicators: Grimacing, Discomfort, Guarding Pain Intervention(s): Limited activity within patient's tolerance, Monitored during session, Repositioned    Home Living Family/patient expects to be discharged to:: Private residence Living Arrangements: Other (Comment) (grandmother) Available Help at Discharge: Family;Available 24 hours/day (can provide supervision but no physical assist) Type of Home: House Home Access: Ramped entrance       Home Layout: One level Home Equipment: None      Prior Function Prior Level of Function : Independent/Modified Independent;Driving;Working/employed        Extremity/Trunk Assessment   Upper Extremity Assessment Upper Extremity Assessment: Defer to OT evaluation    Lower Extremity Assessment Lower Extremity Assessment: Generalized weakness (reports pulling in B posterior thighs)    Cervical / Trunk Assessment Cervical / Trunk Assessment:  (planned back surgery)  Communication   Communication Communication: No apparent difficulties    Cognition Arousal: Alert Behavior During Therapy: WFL for tasks assessed/performed   PT - Cognitive impairments: No apparent impairments     Following commands: Intact       Cueing Cueing Techniques: Verbal cues     General Comments General comments (skin integrity, edema, etc.): Dad in room  and supportive     PT Assessment  Patient needs continued PT services  PT Problem List Decreased strength;Decreased activity tolerance;Decreased balance;Decreased mobility       PT Treatment Interventions DME instruction;Gait training;Stair training;Functional mobility training;Therapeutic activities;Therapeutic exercise;Balance training;Neuromuscular re-education;Patient/family education    PT Goals (Current goals can be found in the Care Plan section)  Acute Rehab PT Goals Patient Stated Goal: to regain independence with mobility PT Goal Formulation: With patient/family Time For Goal Achievement: 12/27/23 Potential to Achieve Goals: Good    Frequency Min 5X/week        AM-PAC PT 6 Clicks Mobility  Outcome Measure Help needed turning from your back to your side while in a flat bed without using bedrails?: None Help needed moving from lying on your back to sitting on the side of a flat bed without using bedrails?: A Little Help needed moving to and from a bed to a chair (including a wheelchair)?: A Little Help needed standing up from a chair using your arms (e.g., wheelchair or bedside chair)?: A Little Help needed to walk in hospital room?: A Little Help needed climbing 3-5 steps with a railing? : A Little 6 Click Score: 19    End of Session   Activity Tolerance: Patient tolerated treatment well Patient left: in bed;with call bell/phone within reach;with family/visitor present Nurse Communication: Mobility status PT Visit Diagnosis: Other abnormalities of gait and mobility (R26.89);Muscle weakness (generalized) (M62.81)    Time: 9144-9086 PT Time Calculation (min) (ACUTE ONLY): 18 min   Charges:   PT Evaluation $PT Eval Low Complexity: 1 Low   PT General Charges $$ ACUTE PT VISIT: 1 Visit        Kate ORN, PT, DPT Secure Chat Preferred  Rehab Office [phone]   Kate BRAVO Wendolyn 12/13/2023, 10:47 AM

## 2023-12-13 NOTE — Evaluation (Signed)
 Occupational Therapy Evaluation Patient Details Name: Courtney Vazquez MRN: [id number] DOB: 1990/07/18 Today's Date: 12/13/2023   History of Present Illness   33 y.o. female presents to [facility] 12/12/23 with cauda equina syndrome 2/2 L5-S1 large disc herniation. Tentative microdiscectomy on 8/21. PMHx: hiatal hernia, obesity, anxiety     Clinical Impressions Pt in bathroom upon entry, completing mobility with supervision with no AD.  Initiated educated on back precautions for planned back surgery.  Pt needing up to min assist for LB ADLs, but overall mobilizing at close supervision level. Will benefit from continued OT services and will follow up post op, but anticipate no further needs after dc.        If plan is discharge home, recommend the following:   A little help with walking and/or transfers;A little help with bathing/dressing/bathroom;Assistance with cooking/housework;Assist for transportation     Functional Status Assessment   Patient has had a recent decline in their functional status and demonstrates the ability to make significant improvements in function in a reasonable and predictable amount of time.     Equipment Recommendations   BSC/3in1     Recommendations for Other Services         Precautions/Restrictions   Precautions Precautions: Fall;Back Precaution Booklet Issued: Yes (comment) Recall of Precautions/Restrictions: Intact Precaution/Restrictions Comments: initated education on back precautions and provided handout for upcoming surgery Restrictions Weight Bearing Restrictions Per Provider Order: No     Mobility Bed Mobility               General bed mobility comments: OOB upon entry    Transfers Overall transfer level: Needs assistance Equipment used: None Transfers: Sit to/from Stand Sit to Stand: Supervision                  Balance Overall balance assessment: No apparent balance deficits (not formally assessed)                                          ADL either performed or assessed with clinical judgement   ADL Overall ADL's : Needs assistance/impaired     Grooming: Supervision/safety;Wash/dry hands;Standing           Upper Body Dressing : Set up;Sitting   Lower Body Dressing: Minimal assistance;Sit to/from stand Lower Body Dressing Details (indicate cue type and reason): educated on figure 4 technique, unable to complete with L LE due to pain. initated education on AE for LB ADLs. Toilet Transfer: Supervision/safety;Ambulation Toilet Transfer Details (indicate cue type and reason): in bathroom upon entry, mobility out of bathroom with close supervision and IV pole mgmt         Functional mobility during ADLs: Supervision/safety       Vision   Vision Assessment?: No apparent visual deficits     Perception         Praxis         Pertinent Vitals/Pain Pain Assessment Pain Assessment: Faces Faces Pain Scale: Hurts even more Pain Location: back Pain Descriptors / Indicators: Grimacing, Discomfort, Guarding Pain Intervention(s): Monitored during session, Limited activity within patient's tolerance, Repositioned     Extremity/Trunk Assessment Upper Extremity Assessment Upper Extremity Assessment: Overall WFL for tasks assessed   Lower Extremity Assessment Lower Extremity Assessment: Defer to PT evaluation   Cervical / Trunk Assessment Cervical / Trunk Assessment:  (planned back surgery)   Communication Communication Communication: No apparent difficulties  Cognition Arousal: Alert Behavior During Therapy: WFL for tasks assessed/performed Cognition: No apparent impairments                               Following commands: Intact       Cueing  General Comments   Cueing Techniques: Verbal cues  dad at side and supportive   Exercises     Shoulder Instructions      Home Living Family/patient expects to be discharged to::  Private residence Living Arrangements: Other (Comment) (grandmother) Available Help at Discharge: Family;Available 24 hours/day (can provide supervision but no physical assist) Type of Home: House Home Access: Ramped entrance     Home Layout: One level     Bathroom Shower/Tub: Producer, television/film/video: Standard     Home Equipment: None          Prior Functioning/Environment Prior Level of Function : Independent/Modified Independent;Driving;Working/employed                    OT Problem List: Decreased strength;Pain;Obesity;Decreased knowledge of precautions;Decreased knowledge of use of DME or AE   OT Treatment/Interventions: Self-care/ADL training;Therapeutic exercise;DME and/or AE instruction;Therapeutic activities;Patient/family education;Balance training      OT Goals(Current goals can be found in the care plan section)   Acute Rehab OT Goals Patient Stated Goal: get better OT Goal Formulation: With patient Time For Goal Achievement: 12/27/23 Potential to Achieve Goals: Good   OT Frequency:  Min 2X/week    Co-evaluation              AM-PAC OT 6 Clicks Daily Activity     Outcome Measure Help from another person eating meals?: None Help from another person taking care of personal grooming?: A Little Help from another person toileting, which includes using toliet, bedpan, or urinal?: A Little Help from another person bathing (including washing, rinsing, drying)?: A Little Help from another person to put on and taking off regular upper body clothing?: A Little Help from another person to put on and taking off regular lower body clothing?: A Little 6 Click Score: 19   End of Session Nurse Communication: Mobility status  Activity Tolerance: Patient tolerated treatment well Patient left: with call bell/phone within reach;with family/visitor present;Other (comment) (sitting EOB)  OT Visit Diagnosis: Pain Pain - Right/Left: Left Pain -  part of body:  (back)                Time: 9144-9086 OT Time Calculation (min): 18 min Charges:  OT General Charges $OT Visit: 1 Visit OT Evaluation $OT Eval Low Complexity: 1 Low  Etta NOVAK, OT Acute Rehabilitation Services Office [phone] Secure Chat Preferred    Etta GORMAN Hope 12/13/2023, 10:17 AM

## 2023-12-13 NOTE — Progress Notes (Signed)
 Patient has returned to 3W23 from PACU. Alert and oriented; mild back pain. All needs addressed. Family at bedside.

## 2023-12-13 NOTE — ED Provider Notes (Signed)
  Physical Exam  BP 139/87 (BP Location: Left Wrist)   Pulse 84   Temp 98.9 F (37.2 C) (Oral)   Resp 18   Ht 5' 2 (1.575 m)   Wt (!) 158.8 kg   LMP 10/31/2023 (Exact Date)   SpO2 94%   BMI 64.02 kg/m   Physical Exam Vitals and nursing note reviewed.  Constitutional:      General: She is not in acute distress.    Appearance: She is well-developed.  HENT:     Head: Normocephalic and atraumatic.  Eyes:     Conjunctiva/sclera: Conjunctivae normal.  Cardiovascular:     Rate and Rhythm: Normal rate and regular rhythm.     Heart sounds: No murmur heard. Pulmonary:     Effort: Pulmonary effort is normal. No respiratory distress.     Breath sounds: Normal breath sounds.  Abdominal:     Palpations: Abdomen is soft.     Tenderness: There is no abdominal tenderness.  Musculoskeletal:        General: No swelling.     Cervical back: Neck supple.  Skin:    General: Skin is warm and dry.     Capillary Refill: Capillary refill takes less than 2 seconds.  Neurological:     Mental Status: She is alert.     Sensory: Sensory deficit present.  Psychiatric:        Mood and Affect: Mood normal.     Procedures  Procedures  ED Course / MDM    Medical Decision Making Amount and/or Complexity of Data Reviewed Labs: ordered. Radiology: ordered.  Risk Prescription drug management.   Patient received in transfer.  Saddle anesthesia pending MRI.  MRI concerning for large disc bulge at L5-S1 with compression of the cauda equina nerve roots.  I spoke with the neurosurgeon on-call Dr. Darnella who will admit the patient for likely surgical intervention in 48 hours.  Steroids initiated and patient admitted       Courtney Dixon, MD 12/13/23 [phone]

## 2023-12-13 NOTE — Anesthesia Procedure Notes (Signed)
 Procedure Name: Intubation Date/Time: 12/13/2023 5:01 PM  Performed by: Ashtyn Freilich C, CRNAPre-anesthesia Checklist: Patient identified, Emergency Drugs available, Suction available and Patient being monitored Patient Re-evaluated:Patient Re-evaluated prior to induction Oxygen Delivery Method: Circle system utilized Preoxygenation: Pre-oxygenation with 100% oxygen Induction Type: IV induction Ventilation: Mask ventilation without difficulty Laryngoscope Size: Mac and 3 Grade View: Grade I Tube type: Oral Tube size: 7.5 mm Number of attempts: 1 Airway Equipment and Method: Stylet and Oral airway Placement Confirmation: ETT inserted through vocal cords under direct vision, positive ETCO2 and breath sounds checked- equal and bilateral Secured at: 22 cm Tube secured with: Tape Dental Injury: Teeth and Oropharynx as per pre-operative assessment

## 2023-12-13 NOTE — Transfer of Care (Signed)
 Immediate Anesthesia Transfer of Care Note  Patient: Courtney Vazquez  Procedure(s) Performed: LUMBAR FIVE-SACRAL ONE MICRODISCECTOMY (Spine Lumbar)  Patient Location: PACU  Anesthesia Type:General  Level of Consciousness: awake, alert , and oriented  Airway & Oxygen Therapy: Patient Spontanous Breathing and Patient connected to face mask oxygen  Post-op Assessment: Report given to RN and Post -op Vital signs reviewed and stable  Post vital signs: Reviewed and stable  Last Vitals:  Vitals Value Taken Time  BP 139/89 12/13/23 19:15  Temp    Pulse 86 12/13/23 19:17  Resp 17 12/13/23 19:18  SpO2 98 % 12/13/23 19:17  Vitals shown include unfiled device data.  Last Pain:  Vitals:   12/13/23 1604  TempSrc:   PainSc: 5       Patients Stated Pain Goal: 2 (12/13/23 1602)  Complications: No notable events documented.

## 2023-12-13 NOTE — Progress Notes (Signed)
 Assessment 33 y/o super obese female (BMI 64) who presents with 1 week of cauda equina syndrome d/t L5-S1 large disc herniation   LOS: 0 days    Plan: OR today for L5-S1 microdisc   Subjective: No change overnight  Objective: Vital signs in last 24 hours: Temp:  [97.8 F (36.6 C)-99 F (37.2 C)] 97.8 F (36.6 C) (08/20 0817) Pulse Rate:  [70-85] 85 (08/20 0817) Resp:  [16-20] 19 (08/20 0817) BP: (137-180)/(79-136) 137/82 (08/20 0817) SpO2:  [94 %-100 %] 99 % (08/20 0817) Weight:  [157 kg-158.8 kg] 157 kg (08/20 0358)  Intake/Output from previous day: No intake/output data recorded. Intake/Output this shift: No intake/output data recorded.  Exam: RUE: 5/5 deltoid, 5/5 bicep, 5/5 wrist extension, 5/5 tricep, 5/5 grip LUE: 5/5 deltoid, 5/5 bicep, 5/5 wrist extension, 5/5 tricep, 5/5 grip RLE: 5/5 IP, 5/5 quad, 5/5 ham, 4/5 DF, 4/5 EHL, 4/5 PF LLE: 5/5 IP, 5/5 quad, 5/5 ham, 4/5 DF, 4/5 EHL, 4/5 PF Subjective numbness of the perianal region   Lab Results: Recent Labs    12/12/23 1555  WBC 15.0*  HGB 12.8  HCT 41.0  PLT 342   BMET Recent Labs    12/12/23 1555  NA 140  K 4.1  CL 102  CO2 26  GLUCOSE 78  BUN 17  CREATININE 0.80  CALCIUM 9.5    Studies/Results: MR Lumbar Spine W Wo Contrast Result Date: 12/12/2023 CLINICAL DATA:  Low back pain, cauda equina syndrome suspected EXAM: MRI LUMBAR SPINE WITHOUT AND WITH CONTRAST TECHNIQUE: Multiplanar and multiecho pulse sequences of the lumbar spine were obtained without and with intravenous contrast. CONTRAST:  10mL GADAVIST  GADOBUTROL  1 MMOL/ML IV SOLN COMPARISON:  None Available. FINDINGS: Segmentation: Standard. Alignment: Normal. Vertebrae: Degenerative/discogenic endplate signal changes at L5-S1. No evidence of acute fracture or suspicious bone lesion. Conus medullaris and cauda equina: Conus extends to the L1-L2 level. Conus appears normal. Paraspinal and other soft tissues: Unremarkable. Disc levels: No  significant canal or foraminal stenosis at T12-L1 through L4-L5. Slight disc bulge and mild facet arthropathy at L4-L5. At L5-S1, disc height loss with large right paracentral disc protrusion which result in severe canal stenosis and impingement of the descending cauda equina nerve roots. Severe right greater than left subarticular recess stenosis. IMPRESSION: At L5-S1, large right paracentral disc protrusion which results in severe canal stenosis and impingement of the descending right greater than left cauda equina nerve roots. Electronically Signed   By: Gilmore GORMAN Molt M.D.   On: 12/12/2023 21:24      [name] 12/13/2023, 8:18 AM

## 2023-12-14 ENCOUNTER — Encounter (HOSPITAL_COMMUNITY): Payer: Self-pay

## 2023-12-14 ENCOUNTER — Telehealth: Payer: Self-pay

## 2023-12-14 LAB — CBC
HCT: 42.9 % (ref 36.0–46.0)
Hemoglobin: 13.3 g/dL (ref 12.0–15.0)
MCH: 27.8 pg (ref 26.0–34.0)
MCHC: 31 g/dL (ref 30.0–36.0)
MCV: 89.7 fL (ref 80.0–100.0)
Platelets: 334 K/uL (ref 150–400)
RBC: 4.78 MIL/uL (ref 3.87–5.11)
RDW: 12.6 % (ref 11.5–15.5)
WBC: 19.1 K/uL — ABNORMAL HIGH (ref 4.0–10.5)
nRBC: 0 % (ref 0.0–0.2)

## 2023-12-14 LAB — CREATININE, SERUM
Creatinine, Ser: 1.05 mg/dL — ABNORMAL HIGH (ref 0.44–1.00)
GFR, Estimated: >60 mL/min (ref >60)

## 2023-12-14 MED ORDER — CYCLOBENZAPRINE HCL 5 MG PO TABS: 5.0000 mg | ORAL_TABLET | Freq: Three times a day (TID) | ORAL | 0 refills | Status: AC | PRN

## 2023-12-14 MED ORDER — POLYETHYLENE GLYCOL 3350 17 G PO PACK: 17.0000 g | PACK | Freq: Every day | ORAL | 0 refills | Status: AC

## 2023-12-14 MED ORDER — OXYCODONE HCL 5 MG PO TABS: 5.0000 mg | ORAL_TABLET | Freq: Four times a day (QID) | ORAL | 0 refills | Status: AC | PRN

## 2023-12-14 NOTE — Progress Notes (Signed)
 Occupational Therapy Treatment Patient Details Name: [name] MRN: [id number] DOB: 03/18/1991 Today's Date: 12/14/2023   History of present illness 33 y.o. female presents to [facility] 12/12/23 with cauda equina syndrome 2/2 L5-S1 large disc herniation. 8/20 R L5-S1 microdiscectomy. PMHx: hiatal hernia, obesity, anxiety   OT comments  Pt agreeable to OT session. Educated on AE for LB ADLs and toileting, as well as compensatory techniques for ADLs.  She is able to figure 4 with L LE today given increased time.  Good adherence to back precautions and understanding of recommendations. Mobilizing with modified independence to supervision.  Will have support of grandmother at home to assist with IADLs. No further OT needs after dc home.  Will follow acutely.       If plan is discharge home, recommend the following:  Assistance with cooking/housework;Assist for transportation   Equipment Recommendations  BSC/3in1    Recommendations for Other Services      Precautions / Restrictions Precautions Precautions: Fall;Back Precaution Booklet Issued: Yes (comment) Recall of Precautions/Restrictions: Intact Restrictions Weight Bearing Restrictions Per Provider Order: No       Mobility Bed Mobility Overal bed mobility: Needs Assistance Bed Mobility: Rolling, Sidelying to Sit, Sit to Supine Rolling: Supervision Sidelying to sit: Supervision     Sit to sidelying: Supervision General bed mobility comments: supervision to EOB with increased time using log roll techinque, returned to supine by scooting into long sitting before laying back.    Transfers Overall transfer level: Needs assistance Equipment used: None Transfers: Sit to/from Stand Sit to Stand: Modified independent (Device/Increase time)                 Balance Overall balance assessment: No apparent balance deficits (not formally assessed)                                         ADL either  performed or assessed with clinical judgement   ADL Overall ADL's : Needs assistance/impaired     Grooming: Supervision/safety;Wash/dry hands;Standing           Upper Body Dressing : Set up;Sitting   Lower Body Dressing: Sit to/from stand;Supervision/safety Lower Body Dressing Details (indicate cue type and reason): pt able to figure 4 today, also able to sit sidesways on EOB to improve reach to L LE Toilet Transfer: Supervision/safety;Ambulation   Toileting- Clothing Manipulation and Hygiene: Supervision/safety;Sit to/from stand;Sitting/lateral lean Toileting - Clothing Manipulation Details (indicate cue type and reason): reviewed techniques for toileting, hygiene and AE (toieting aide)     Functional mobility during ADLs: Supervision/safety;Rolling walker (2 wheels)      Extremity/Trunk Assessment Upper Extremity Assessment Upper Extremity Assessment: Overall WFL for tasks assessed   Lower Extremity Assessment Lower Extremity Assessment: Defer to PT evaluation        Vision   Vision Assessment?: No apparent visual deficits   Perception     Praxis     Communication Communication Communication: No apparent difficulties   Cognition Arousal: Alert Behavior During Therapy: WFL for tasks assessed/performed Cognition: No apparent impairments                               Following commands: Intact        Cueing   Cueing Techniques: Verbal cues  Exercises      Shoulder Instructions  General Comments      Pertinent Vitals/ Pain       Pain Assessment Pain Assessment: Faces Faces Pain Scale: Hurts a little bit Pain Location: incision Pain Descriptors / Indicators: Grimacing, Discomfort, Guarding Pain Intervention(s): Limited activity within patient's tolerance, Monitored during session, Repositioned  Home Living                                          Prior Functioning/Environment              Frequency  Min  2X/week        Progress Toward Goals  OT Goals(current goals can now be found in the care plan section)  Progress towards OT goals: Progressing toward goals  Acute Rehab OT Goals Patient Stated Goal: get home today OT Goal Formulation: With patient Time For Goal Achievement: 12/27/23 Potential to Achieve Goals: Good  Plan      Co-evaluation                 AM-PAC OT 6 Clicks Daily Activity     Outcome Measure   Help from another person eating meals?: None Help from another person taking care of personal grooming?: A Little Help from another person toileting, which includes using toliet, bedpan, or urinal?: A Little Help from another person bathing (including washing, rinsing, drying)?: A Little Help from another person to put on and taking off regular upper body clothing?: A Little Help from another person to put on and taking off regular lower body clothing?: A Little 6 Click Score: 19    End of Session    OT Visit Diagnosis: Pain Pain - Right/Left: Left Pain - part of body:  (back)   Activity Tolerance Patient tolerated treatment well   Patient Left with call bell/phone within reach;in bed;with nursing/sitter in room   Nurse Communication Mobility status        Time: 9144-9071 OT Time Calculation (min): 33 min  Charges: OT General Charges $OT Visit: 1 Visit OT Treatments $Self Care/Home Management : 23-37 mins  Courtney Vazquez, OT Acute Rehabilitation Services Office [phone] Secure Chat Preferred    Courtney Vazquez Courtney Vazquez 12/14/2023, 10:19 AM

## 2023-12-14 NOTE — TOC Transition Note (Signed)
 Transition of Care [facility]) - Discharge Note   Patient Details  Name: Courtney Vazquez MRN: [id number] Date of Birth: 1991/01/21  [facility] CM/SW Contact:  Andrez JULIANNA George, RN Phone Number: 12/14/2023, 10:14 AM   Clinical Narrative:     Pt is discharging home with self care. Outpatient therapy recommended. Pt will follow up with surgeon before this is arranged.  BSC recommended. Pt refused and states she has family that has BSC if needed.  Pt has transportation home.  Final next level of care: Home/Self Care Barriers to Discharge: No Barriers Identified   Patient Goals and CMS Choice            Discharge Placement                       Discharge Plan and Services Additional resources added to the After Visit Summary for                                       Social Drivers of Health (SDOH) Interventions SDOH Screenings   Food Insecurity: No Food Insecurity (12/13/2023)  Housing: Low Risk  (12/13/2023)  Transportation Needs: No Transportation Needs (12/13/2023)  Utilities: Not At Risk (12/13/2023)  Tobacco Use: Unknown (12/13/2023)     Readmission Risk Interventions     No data to display

## 2023-12-14 NOTE — Progress Notes (Signed)
 Patient ambulated twice in the hall during shift. Tolerated well.

## 2023-12-14 NOTE — Telephone Encounter (Signed)
 Patient called and says she was discharged today from the hospital and was prescribed Oxycodone , miralax , cyclobenzaprine  to start. She says she was taking gabapentin , mucinex , prednisone , and naproxen  previously prescribed by her primary care doctor. She asks what does she need to do about taking those. She also says on her discharge papers it has to start the oxycodone , miralax , and cyclobenzaprine , but doesn't say anything about the others. Advised that when a patient is discharged and it says to start these medications, that is in addition to what was taken prior to admission. If there is a stop these medications, then stop those. Advised that she will need to call her PCP office tomorrow to let them know she was just discharged and has questions about medications and they will assist her from there. She asked what is pantoprazole  because that is on her list, but she doesn't take it. Advised if her medication list is not updated to remove meds she's no longer on, then it will be there, advised this was prescribed in 2023 at an ED visit. She says she doesn't take it and asked what it's for, advised acid reflux. She verbalized understanding of all above.  Copied from CRM [id number]. Topic: Clinical - Medication Question >> Dec 14, 2023  4:56 PM Mercer PEDLAR wrote: Reason for CRM: Patient would like callback to discuss medications she received after being discharged today 12/14/23.

## 2023-12-14 NOTE — Progress Notes (Signed)
 Physical Therapy Treatment Patient Details Name: Courtney Vazquez MRN: [id number] DOB: [birth year] Today's Date: 12/14/2023   History of Present Illness 33 y.o. female presents to [facility] 12/12/23 with cauda equina syndrome 2/2 L5-S1 large disc herniation. 8/20 R L5-S1 microdiscectomy. PMHx: hiatal hernia, obesity, anxiety   PT Comments  Pt in bed upon arrival and agreeable to PT session. Pt was able to recall all spinal precautions and perform bed mobility with MinA. Practiced stair negotiation using bilateral handrails and with one handrail and SP cane. Pt preferred using SP cane and has access to one upon returning home. Able to ambulate 480ft with supervision and no AD. Pt is feeling comfortable discharging home with acute PT to continue to follow.     If plan is discharge home, recommend the following: A little help with walking and/or transfers;Assist for transportation;Help with stairs or ramp for entrance;A little help with bathing/dressing/bathroom   Can travel by private vehicle      Yes  Equipment Recommendations  None recommended by PT       Precautions / Restrictions Precautions Precautions: Fall;Back Precaution Booklet Issued: Yes (comment) Recall of Precautions/Restrictions: Intact Restrictions Weight Bearing Restrictions Per Provider Order: No     Mobility  Bed Mobility Overal bed mobility: Needs Assistance Bed Mobility: Rolling, Sidelying to Sit, Sit to Sidelying Rolling: Contact guard assist Sidelying to sit: Contact guard assist    Sit to sidelying: Min assist General bed mobility comments: MinA for slight LE management    Transfers Overall transfer level: Needs assistance Equipment used: None Transfers: Sit to/from Stand Sit to Stand: Modified independent (Device/Increase time)   Ambulation/Gait Ambulation/Gait assistance: Supervision Gait Distance (Feet): 400 Feet Assistive device: None Gait Pattern/deviations: Step-through pattern, Decreased stride  length Gait velocity: decr    General Gait Details: steady gait with no overt LOB   Stairs Stairs: Yes Stairs assistance: Contact guard assist Stair Management: One rail Right, One rail Left, Step to pattern, Forwards, With cane Number of Stairs: 6 General stair comments: x4 steps with B handrails and step-to pattern. Practiced with SP cane and one handrail     Balance Overall balance assessment: No apparent balance deficits (not formally assessed)        Communication Communication Communication: No apparent difficulties  Cognition Arousal: Alert Behavior During Therapy: WFL for tasks assessed/performed   PT - Cognitive impairments: No apparent impairments      Following commands: Intact      Cueing Cueing Techniques: Verbal cues         Pertinent Vitals/Pain Pain Assessment Pain Assessment: Faces Faces Pain Scale: Hurts little more Pain Location: incision Pain Descriptors / Indicators: Grimacing, Discomfort, Guarding Pain Intervention(s): Limited activity within patient's tolerance, Monitored during session, Repositioned     PT Goals (current goals can now be found in the care plan section) Acute Rehab PT Goals Patient Stated Goal: to regain independence with mobility PT Goal Formulation: With patient/family Time For Goal Achievement: 12/27/23 Potential to Achieve Goals: Good Progress towards PT goals: Progressing toward goals    Frequency    Min 5X/week       AM-PAC PT 6 Clicks Mobility   Outcome Measure  Help needed turning from your back to your side while in a flat bed without using bedrails?: None Help needed moving from lying on your back to sitting on the side of a flat bed without using bedrails?: A Little Help needed moving to and from a bed to a chair (including a wheelchair)?: A  Little Help needed standing up from a chair using your arms (e.g., wheelchair or bedside chair)?: None Help needed to walk in hospital room?: A Little Help  needed climbing 3-5 steps with a railing? : A Little 6 Click Score: 20    End of Session Equipment Utilized During Treatment: Gait belt Activity Tolerance: Patient tolerated treatment well Patient left: in bed;with call bell/phone within reach;with family/visitor present Nurse Communication: Mobility status PT Visit Diagnosis: Other abnormalities of gait and mobility (R26.89);Muscle weakness (generalized) (M62.81)     Time: 9193-9169 PT Time Calculation (min) (ACUTE ONLY): 24 min  Charges:    $Gait Training: 8-22 mins $Therapeutic Activity: 8-22 mins PT General Charges $$ ACUTE PT VISIT: 1 Visit                    Kate ORN, PT, DPT Secure Chat Preferred  Rehab Office [phone]    Kate BRAVO Wendolyn 12/14/2023, 9:18 AM

## 2023-12-14 NOTE — Plan of Care (Signed)
  Problem: Education: Goal: Knowledge of General Education information will improve Description: Including pain rating scale, medication(s)/side effects and non-pharmacologic comfort measures Outcome: Adequate for Discharge   Problem: Health Behavior/Discharge Planning: Goal: Ability to manage health-related needs will improve Outcome: Adequate for Discharge   Problem: Clinical Measurements: Goal: Ability to maintain clinical measurements within normal limits will improve Outcome: Adequate for Discharge Goal: Will remain free from infection Outcome: Adequate for Discharge Goal: Diagnostic test results will improve Outcome: Adequate for Discharge Goal: Respiratory complications will improve Outcome: Adequate for Discharge Goal: Cardiovascular complication will be avoided Outcome: Adequate for Discharge   Problem: Activity: Goal: Risk for activity intolerance will decrease Outcome: Adequate for Discharge   Problem: Coping: Goal: Level of anxiety will decrease Outcome: Adequate for Discharge   Problem: Elimination: Goal: Will not experience complications related to bowel motility Outcome: Adequate for Discharge Goal: Will not experience complications related to urinary retention Outcome: Adequate for Discharge   Problem: Pain Managment: Goal: General experience of comfort will improve and/or be controlled Outcome: Adequate for Discharge   Problem: Safety: Goal: Ability to remain free from injury will improve Outcome: Adequate for Discharge   Problem: Skin Integrity: Goal: Risk for impaired skin integrity will decrease Outcome: Adequate for Discharge   Problem: Education: Goal: Ability to verbalize activity precautions or restrictions will improve Outcome: Adequate for Discharge Goal: Knowledge of the prescribed therapeutic regimen will improve Outcome: Adequate for Discharge Goal: Understanding of discharge needs will improve Outcome: Adequate for Discharge    Problem: Activity: Goal: Ability to avoid complications of mobility impairment will improve Outcome: Adequate for Discharge Goal: Ability to tolerate increased activity will improve Outcome: Adequate for Discharge Goal: Will remain free from falls Outcome: Adequate for Discharge   Problem: Bowel/Gastric: Goal: Gastrointestinal status for postoperative course will improve Outcome: Adequate for Discharge   Problem: Clinical Measurements: Goal: Ability to maintain clinical measurements within normal limits will improve Outcome: Adequate for Discharge Goal: Postoperative complications will be avoided or minimized Outcome: Adequate for Discharge Goal: Diagnostic test results will improve Outcome: Adequate for Discharge   Problem: Pain Management: Goal: Pain level will decrease Outcome: Adequate for Discharge   Problem: Skin Integrity: Goal: Will show signs of wound healing Outcome: Adequate for Discharge   Problem: Health Behavior/Discharge Planning: Goal: Identification of resources available to assist in meeting health care needs will improve Outcome: Adequate for Discharge   Problem: Bladder/Genitourinary: Goal: Urinary functional status for postoperative course will improve Outcome: Adequate for Discharge

## 2023-12-14 NOTE — Discharge Instructions (Signed)
 Wound Care Please try to maintain your dressing for 48 hours. No dressing is required over your incision after the 48 hour mark. Keep clean and dry. You can shower 48 hours after surgery. Let water run over incision. Do not scrub. Pat dry Do not put any creams, lotions, or ointments on incision. Do not submerge your incision for the first 6 weeks (pool, ocean, etc)  Activity Walk each and every day, increasing distance each day. No lifting greater than 10 lbs.  Avoid excessive back motion.  Diet Resume your normal diet.  Call Your Doctor If Any of These Occur Redness, drainage, or swelling at the wound.  Temperature greater than 101 degrees. Severe pain not relieved by pain medication. Incision starts to come apart.  Follow Up Appt Call [phone] today for appointment in 2-3 weeks if you don't already have one or for any problems.  If you have any hardware placed in your spine, you will need an x-ray before your appointment.

## 2023-12-14 NOTE — Discharge Summary (Signed)
 Physician Discharge Summary  Patient ID: Courtney Vazquez MRN: [id number] DOB/AGE: October 06, 1990 33 y.o.  Admit date: 12/12/2023 Discharge date: 12/14/2023  Admission Diagnoses:  Cauda equina  Discharge Diagnoses:  Same Principal Problem:   Cauda equina compression [facility])   Discharged Condition: Stable  Hospital Course:  Courtney Vazquez is a 33 y.o. female w/ hx super obesity BMI 64 who presented with vaginal numbness, severe LLE pain, RLE numbness for 1 week. MRI showed large L5-S1 disc herniation. She was taken for L5-S1 microdiscectomy right side. No issues during the case Postoperatively, the patient was mobilized with the help PT, pain was well-controlled with oral meds. Patient reported improved ambulation ability postop. Her vaginal numbness was improved. Her LLE pain was resolved. Patient was deemed ready for discharge.   Treatments: Surgery - right L5-S1 microdisc  Discharge Exam: Blood pressure (!) 150/86, pulse 72, temperature 98.2 F (36.8 C), resp. rate 18, height 5' 2.5 (1.588 m), weight (!) 156.9 kg, last menstrual period 10/31/2023, SpO2 100%. Awake, alert, oriented Speech fluent, appropriate CN grossly intact 5/5 BUE/BLE Wound c/d/i  Disposition: Discharge disposition: 01-Home or Self Care       Discharge Instructions     Diet general   Complete by: As directed    Discharge wound care:   Complete by: As directed    Per instructions   Incentive spirometry RT   Complete by: As directed    Incentive spirometry RT   Complete by: As directed    Increase activity slowly   Complete by: As directed       Allergies as of 12/14/2023       Reactions   Robitussin [guaifenesin ] Nausea And Vomiting   Sudafed [pseudoephedrine Hcl] Other (See Comments)   Hallucinations        Medication List     TAKE these medications    cyclobenzaprine  5 MG tablet Commonly known as: FLEXERIL  Take 1 tablet (5 mg total) by mouth 3 (three) times daily as  needed for muscle spasms.   gabapentin  300 MG capsule Commonly known as: NEURONTIN  Take 300 mg by mouth at bedtime.   guaiFENesin  600 MG 12 hr tablet Commonly known as: MUCINEX  Take 1,200 mg by mouth 2 (two) times daily.   methylPREDNISolone 4 MG Tbpk tablet Commonly known as: MEDROL DOSEPAK Take by mouth as directed.   naproxen  500 MG tablet Commonly known as: NAPROSYN  Take 1 tablet (500 mg total) by mouth 2 (two) times daily with a meal.   oxyCODONE  5 MG immediate release tablet Commonly known as: Oxy IR/ROXICODONE  Take 1 tablet (5 mg total) by mouth every 6 (six) hours as needed for severe pain (pain score 7-10).   pantoprazole  20 MG tablet Commonly known as: PROTONIX  Take 1 tablet (20 mg total) by mouth daily for 14 days.   polyethylene glycol 17 g packet Commonly known as: MiraLax  Take 17 g by mouth daily.               Discharge Care Instructions  (From admission, onward)           Start     Ordered   12/14/23 0000  Discharge wound care:       Comments: Per instructions   12/14/23 9244            Follow-up Information     Courtney Courtney SAUNDERS, MD. Call in 2 week(s).   Specialty: Neurosurgery Why: please call to schedule a wound check in 2-3 weeks for suture  removal. We will also plan for a 6 week postoperative follow-up Contact information: [address], [address] [place] [address] [phone]                 Signed: [name] 12/14/2023, 8:13 AM

## 2024-02-26 DIAGNOSIS — J209 Acute bronchitis, unspecified: Secondary | ICD-10-CM | POA: Diagnosis not present

## 2024-03-15 DIAGNOSIS — J329 Chronic sinusitis, unspecified: Secondary | ICD-10-CM | POA: Diagnosis not present
